# Patient Record
Sex: Male | Born: 1955
Health system: Southern US, Community
[De-identification: ages and names within clinical notes are randomized; demographics above are authoritative.]

---

## 2000-01-28 ENCOUNTER — Encounter: Payer: Self-pay | Admitting: *Deleted

## 2000-01-28 ENCOUNTER — Encounter: Admission: RE | Admit: 2000-01-28 | Discharge: 2000-01-28 | Payer: Self-pay | Admitting: *Deleted

## 2004-04-05 ENCOUNTER — Ambulatory Visit (HOSPITAL_COMMUNITY): Admission: RE | Admit: 2004-04-05 | Discharge: 2004-04-05 | Payer: Self-pay | Admitting: Gastroenterology

## 2011-03-11 ENCOUNTER — Ambulatory Visit (INDEPENDENT_AMBULATORY_CARE_PROVIDER_SITE_OTHER): Payer: Self-pay | Admitting: Family Medicine

## 2011-03-11 DIAGNOSIS — Z713 Dietary counseling and surveillance: Secondary | ICD-10-CM

## 2015-02-26 DIAGNOSIS — J209 Acute bronchitis, unspecified: Secondary | ICD-10-CM | POA: Diagnosis not present

## 2015-02-26 DIAGNOSIS — J101 Influenza due to other identified influenza virus with other respiratory manifestations: Secondary | ICD-10-CM | POA: Diagnosis not present

## 2015-02-26 MED FILL — HYDROCODONE-CHLORPHENIRAM S: 10-8 | 12 days supply | Qty: 60 | Fill #0

## 2015-02-26 MED FILL — AZITHROMYCIN 250 MG TABLET: 250 | 5 days supply | Qty: 6 | Fill #0

## 2015-02-26 MED FILL — OSELTAMIVIR PHOS 75 MG CAP: 75 | 5 days supply | Qty: 10 | Fill #0

## 2015-05-16 DIAGNOSIS — R030 Elevated blood-pressure reading, without diagnosis of hypertension: Secondary | ICD-10-CM | POA: Diagnosis not present

## 2015-05-16 DIAGNOSIS — Z13220 Encounter for screening for lipoid disorders: Secondary | ICD-10-CM | POA: Diagnosis not present

## 2015-05-16 DIAGNOSIS — Z125 Encounter for screening for malignant neoplasm of prostate: Secondary | ICD-10-CM | POA: Diagnosis not present

## 2015-05-16 DIAGNOSIS — Z0000 Encounter for general adult medical examination without abnormal findings: Secondary | ICD-10-CM | POA: Diagnosis not present

## 2015-05-18 MED FILL — AZITHROMYCIN 250 MG TABLET: 250 | 5 days supply | Qty: 6 | Fill #0

## 2015-07-31 MED FILL — LISINOPRIL 10 MG TABLET: 10 | 30 days supply | Qty: 30 | Fill #0

## 2015-08-21 DIAGNOSIS — D72829 Elevated white blood cell count, unspecified: Secondary | ICD-10-CM | POA: Diagnosis not present

## 2015-08-21 DIAGNOSIS — R635 Abnormal weight gain: Secondary | ICD-10-CM | POA: Diagnosis not present

## 2015-08-21 DIAGNOSIS — I10 Essential (primary) hypertension: Secondary | ICD-10-CM | POA: Diagnosis not present

## 2015-08-21 MED FILL — LISINOPRIL 20 MG TABLET: 20 | 90 days supply | Qty: 90 | Fill #0

## 2015-10-26 DIAGNOSIS — H5213 Myopia, bilateral: Secondary | ICD-10-CM | POA: Diagnosis not present

## 2015-11-21 DIAGNOSIS — I10 Essential (primary) hypertension: Secondary | ICD-10-CM | POA: Diagnosis not present

## 2015-11-21 MED FILL — AMLODIPINE BESYLATE 5 MG TA: 5 | 30 days supply | Qty: 30 | Fill #0

## 2015-11-21 MED FILL — LISINOPRIL 20 MG TABLET: 20 | 90 days supply | Qty: 90 | Fill #0

## 2015-12-21 MED FILL — AMLODIPINE BESYLATE 5 MG TA: 5 | 90 days supply | Qty: 90 | Fill #1

## 2016-01-02 DIAGNOSIS — K219 Gastro-esophageal reflux disease without esophagitis: Secondary | ICD-10-CM | POA: Diagnosis not present

## 2016-01-02 DIAGNOSIS — I10 Essential (primary) hypertension: Secondary | ICD-10-CM | POA: Diagnosis not present

## 2016-01-02 MED FILL — OMEPRAZOLE DR 40 MG CAPSULE: 40 | 90 days supply | Qty: 90 | Fill #0

## 2016-02-25 MED FILL — LISINOPRIL 20 MG TABLET: 20 | 90 days supply | Qty: 90 | Fill #1

## 2016-03-18 MED FILL — AMLODIPINE BESYLATE 5 MG TA: 5 | 60 days supply | Qty: 60 | Fill #2

## 2016-05-22 DIAGNOSIS — Z125 Encounter for screening for malignant neoplasm of prostate: Secondary | ICD-10-CM | POA: Diagnosis not present

## 2016-05-22 DIAGNOSIS — D72829 Elevated white blood cell count, unspecified: Secondary | ICD-10-CM | POA: Diagnosis not present

## 2016-05-22 DIAGNOSIS — Z0000 Encounter for general adult medical examination without abnormal findings: Secondary | ICD-10-CM | POA: Diagnosis not present

## 2016-05-22 DIAGNOSIS — K219 Gastro-esophageal reflux disease without esophagitis: Secondary | ICD-10-CM | POA: Diagnosis not present

## 2016-05-22 DIAGNOSIS — I10 Essential (primary) hypertension: Secondary | ICD-10-CM | POA: Diagnosis not present

## 2016-05-22 DIAGNOSIS — Z13220 Encounter for screening for lipoid disorders: Secondary | ICD-10-CM | POA: Diagnosis not present

## 2016-05-22 MED FILL — AMLODIPINE BESYLATE 5 MG TA: 5 | 90 days supply | Qty: 90 | Fill #0

## 2016-05-22 MED FILL — OMEPRAZOLE DR 40 MG CAPSULE: 40 | 90 days supply | Qty: 90 | Fill #0

## 2016-05-22 MED FILL — LISINOPRIL 20 MG TAB: 20 | 90 days supply | Qty: 90 | Fill #0

## 2016-08-22 MED FILL — AMLODIPINE BESYLATE 5 MG TA: 5 | 90 days supply | Qty: 90 | Fill #1

## 2016-08-22 MED FILL — LISINOPRIL 20 MG TAB: 20 | 90 days supply | Qty: 90 | Fill #1

## 2016-08-23 MED FILL — OMEPRAZOLE DR 40 MG CAPSULE: 40 | 90 days supply | Qty: 90 | Fill #1

## 2016-11-04 DIAGNOSIS — H5213 Myopia, bilateral: Secondary | ICD-10-CM | POA: Diagnosis not present

## 2016-11-26 MED FILL — LISINOPRIL 20 MG TAB: 20 | 90 days supply | Qty: 90 | Fill #0

## 2016-11-26 MED FILL — AMLODIPINE BESYLATE 5 MG TA: 5 | 90 days supply | Qty: 90 | Fill #0

## 2016-11-26 MED FILL — OMEPRAZOLE DR 40 MG CAPSULE: 40 | 90 days supply | Qty: 90 | Fill #1

## 2016-12-04 DIAGNOSIS — I10 Essential (primary) hypertension: Secondary | ICD-10-CM | POA: Diagnosis not present

## 2016-12-04 DIAGNOSIS — Z6834 Body mass index (BMI) 34.0-34.9, adult: Secondary | ICD-10-CM | POA: Diagnosis not present

## 2016-12-04 DIAGNOSIS — K219 Gastro-esophageal reflux disease without esophagitis: Secondary | ICD-10-CM | POA: Diagnosis not present

## 2016-12-10 ENCOUNTER — Telehealth: Payer: Self-pay | Admitting: Nurse Practitioner

## 2016-12-10 DIAGNOSIS — R059 Cough, unspecified: Secondary | ICD-10-CM

## 2016-12-10 DIAGNOSIS — R05 Cough: Secondary | ICD-10-CM

## 2016-12-10 NOTE — Progress Notes (Signed)

## 2016-12-17 DIAGNOSIS — J0100 Acute maxillary sinusitis, unspecified: Secondary | ICD-10-CM | POA: Diagnosis not present

## 2016-12-17 DIAGNOSIS — J3489 Other specified disorders of nose and nasal sinuses: Secondary | ICD-10-CM | POA: Diagnosis not present

## 2016-12-17 MED FILL — HYDROCODONE-CHLORPHENIRAM S: 10-8 | 5 days supply | Qty: 50 | Fill #0

## 2017-03-02 MED FILL — AMLODIPINE BESYLATE 5 MG TA: 5 | 90 days supply | Qty: 90 | Fill #0

## 2017-03-02 MED FILL — LISINOPRIL 20 MG TABLET: 20 | 90 days supply | Qty: 90 | Fill #0

## 2017-03-02 MED FILL — OMEPRAZOLE DR 40 MG CAPSULE: 40 | 90 days supply | Qty: 90 | Fill #0

## 2017-05-26 DIAGNOSIS — K219 Gastro-esophageal reflux disease without esophagitis: Secondary | ICD-10-CM | POA: Diagnosis not present

## 2017-05-26 DIAGNOSIS — Z6834 Body mass index (BMI) 34.0-34.9, adult: Secondary | ICD-10-CM | POA: Diagnosis not present

## 2017-05-26 DIAGNOSIS — Z125 Encounter for screening for malignant neoplasm of prostate: Secondary | ICD-10-CM | POA: Diagnosis not present

## 2017-05-26 DIAGNOSIS — I10 Essential (primary) hypertension: Secondary | ICD-10-CM | POA: Diagnosis not present

## 2017-05-26 DIAGNOSIS — Z0000 Encounter for general adult medical examination without abnormal findings: Secondary | ICD-10-CM | POA: Diagnosis not present

## 2017-05-26 DIAGNOSIS — Z13220 Encounter for screening for lipoid disorders: Secondary | ICD-10-CM | POA: Diagnosis not present

## 2017-05-29 MED FILL — AMLODIPINE BESYLATE 5 MG TA: 5 | 90 days supply | Qty: 90 | Fill #1

## 2017-05-29 MED FILL — LISINOPRIL 20 MG TABLET: 20 | 90 days supply | Qty: 90 | Fill #1

## 2017-05-29 MED FILL — OMEPRAZOLE 40 MG CPDR: 40 | 90 days supply | Qty: 90 | Fill #1

## 2017-09-01 MED FILL — OMEPRAZOLE 40 MG CPDR: 40 | 90 days supply | Qty: 90 | Fill #0

## 2017-09-01 MED FILL — LISINOPRIL 20 MG TABLET: 20 | 90 days supply | Qty: 90 | Fill #0

## 2017-09-08 MED FILL — AMLODIPINE BESYLATE 5 MG TA: 5 | 90 days supply | Qty: 90 | Fill #0

## 2017-11-10 DIAGNOSIS — H353111 Nonexudative age-related macular degeneration, right eye, early dry stage: Secondary | ICD-10-CM | POA: Diagnosis not present

## 2017-11-10 DIAGNOSIS — H5213 Myopia, bilateral: Secondary | ICD-10-CM | POA: Diagnosis not present

## 2017-11-10 DIAGNOSIS — D3132 Benign neoplasm of left choroid: Secondary | ICD-10-CM | POA: Diagnosis not present

## 2017-12-01 DIAGNOSIS — I10 Essential (primary) hypertension: Secondary | ICD-10-CM | POA: Diagnosis not present

## 2017-12-14 MED FILL — AMLODIPINE BESYLATE 5 MG TA: 5 | 90 days supply | Qty: 90 | Fill #0

## 2017-12-14 MED FILL — LISINOPRIL 20 MG TABLET: 20 | 90 days supply | Qty: 90 | Fill #0

## 2017-12-14 MED FILL — OMEPRAZOLE 40 MG CPDR: 40 | 90 days supply | Qty: 90 | Fill #0

## 2018-03-16 MED FILL — OMEPRAZOLE 40 MG CPDR: 40 | 90 days supply | Qty: 90 | Fill #1

## 2018-03-16 MED FILL — AMLODIPINE BESYLATE 5 MG TA: 5 | 90 days supply | Qty: 90 | Fill #1

## 2018-03-16 MED FILL — LISINOPRIL 20 MG TABLET: 20 | 90 days supply | Qty: 90 | Fill #1

## 2018-06-01 DIAGNOSIS — R490 Dysphonia: Secondary | ICD-10-CM | POA: Diagnosis not present

## 2018-06-01 DIAGNOSIS — Z125 Encounter for screening for malignant neoplasm of prostate: Secondary | ICD-10-CM | POA: Diagnosis not present

## 2018-06-01 DIAGNOSIS — I10 Essential (primary) hypertension: Secondary | ICD-10-CM | POA: Diagnosis not present

## 2018-06-01 DIAGNOSIS — K219 Gastro-esophageal reflux disease without esophagitis: Secondary | ICD-10-CM | POA: Diagnosis not present

## 2018-06-01 DIAGNOSIS — Z0000 Encounter for general adult medical examination without abnormal findings: Secondary | ICD-10-CM | POA: Diagnosis not present

## 2018-06-01 DIAGNOSIS — Z13220 Encounter for screening for lipoid disorders: Secondary | ICD-10-CM | POA: Diagnosis not present

## 2018-06-01 MED FILL — OMEPRAZOLE 40 MG CPDR: 40 | 90 days supply | Qty: 90 | Fill #0

## 2018-06-01 MED FILL — LISINOPRIL 20 MG TABLET: 20 | 90 days supply | Qty: 90 | Fill #0

## 2018-06-01 MED FILL — AMLODIPINE BESYLATE 5 MG TA: 5 | 90 days supply | Qty: 90 | Fill #0

## 2018-06-22 DIAGNOSIS — Z125 Encounter for screening for malignant neoplasm of prostate: Secondary | ICD-10-CM | POA: Diagnosis not present

## 2018-06-22 DIAGNOSIS — Z23 Encounter for immunization: Secondary | ICD-10-CM | POA: Diagnosis not present

## 2018-06-22 DIAGNOSIS — I10 Essential (primary) hypertension: Secondary | ICD-10-CM | POA: Diagnosis not present

## 2018-06-22 DIAGNOSIS — Z13220 Encounter for screening for lipoid disorders: Secondary | ICD-10-CM | POA: Diagnosis not present

## 2018-09-01 DIAGNOSIS — Z23 Encounter for immunization: Secondary | ICD-10-CM | POA: Diagnosis not present

## 2018-09-21 MED FILL — LISINOPRIL 20 MG TABLET: 20 | 90 days supply | Qty: 90 | Fill #0

## 2018-09-21 MED FILL — AMLODIPINE BESYLATE 5 MG TA: 5 | 90 days supply | Qty: 90 | Fill #0

## 2018-09-21 MED FILL — OMEPRAZOLE DR 40 MG CAPSULE: 40 | 90 days supply | Qty: 90 | Fill #0

## 2018-11-16 DIAGNOSIS — H5213 Myopia, bilateral: Secondary | ICD-10-CM | POA: Diagnosis not present

## 2018-11-16 DIAGNOSIS — D3132 Benign neoplasm of left choroid: Secondary | ICD-10-CM | POA: Diagnosis not present

## 2018-11-16 MED FILL — FLUAD QUADRIVALENT 0.5 ML P: 0.5 | 1 days supply | Qty: 1 | Fill #0

## 2018-11-22 DIAGNOSIS — E669 Obesity, unspecified: Secondary | ICD-10-CM | POA: Diagnosis not present

## 2018-11-22 DIAGNOSIS — I10 Essential (primary) hypertension: Secondary | ICD-10-CM | POA: Diagnosis not present

## 2018-11-22 DIAGNOSIS — K219 Gastro-esophageal reflux disease without esophagitis: Secondary | ICD-10-CM | POA: Diagnosis not present

## 2018-12-21 MED FILL — LISINOPRIL 20 MG TABLET: 20 | 90 days supply | Qty: 90 | Fill #0

## 2018-12-21 MED FILL — AMLODIPINE BESYLATE 5 MG TA: 5 | 90 days supply | Qty: 90 | Fill #0

## 2018-12-21 MED FILL — OMEPRAZOLE DR 40 MG CAPSULE: 40 | 90 days supply | Qty: 90 | Fill #0

## 2018-12-22 DIAGNOSIS — Z23 Encounter for immunization: Secondary | ICD-10-CM | POA: Diagnosis not present

## 2018-12-22 DIAGNOSIS — I10 Essential (primary) hypertension: Secondary | ICD-10-CM | POA: Diagnosis not present

## 2019-03-04 ENCOUNTER — Ambulatory Visit: Payer: 59 | Attending: Internal Medicine

## 2019-03-04 DIAGNOSIS — Z23 Encounter for immunization: Secondary | ICD-10-CM | POA: Insufficient documentation

## 2019-03-04 NOTE — Progress Notes (Signed)
   Covid-19 Vaccination Clinic  Name:  Steven Velasquez    MRN: [id number] DOB: [birth year]  03/04/2019  [name] was observed post Covid-19 immunization for 15 minutes without incidence. He was provided with Vaccine Information Sheet and instruction to access the V-Safe system.   [name] was instructed to call 911 with any severe reactions post vaccine: Marland Kitchen Difficulty breathing  . Swelling of your face and throat  . A fast heartbeat  . A bad rash all over your body  . Dizziness and weakness    Immunizations Administered    Name Date Dose VIS Date Route   Pfizer COVID-19 Vaccine 03/04/2019 12:53 PM 0.3 mL 12/31/2018 Intramuscular   Manufacturer: [place]   Lot: [id number]   [place]: [id number]

## 2019-03-28 ENCOUNTER — Ambulatory Visit: Payer: 59 | Attending: Internal Medicine

## 2019-03-28 DIAGNOSIS — Z23 Encounter for immunization: Secondary | ICD-10-CM | POA: Insufficient documentation

## 2019-03-28 MED FILL — OMEPRAZOLE 40 MG CPDR: 40 | 90 days supply | Qty: 90 | Fill #1

## 2019-03-28 MED FILL — AMLODIPINE BESYLATE 5 MG TA: 5 | 90 days supply | Qty: 90 | Fill #1

## 2019-03-28 MED FILL — LISINOPRIL 20 MG TABLET: 20 | 90 days supply | Qty: 90 | Fill #1

## 2019-03-28 NOTE — Progress Notes (Signed)
   Covid-19 Vaccination Clinic  Name:  Steven Velasquez    MRN: [id number] DOB: 03-29-1955  03/28/2019  [name] was observed post Covid-19 immunization for 15 minutes without incident. He was provided with Vaccine Information Sheet and instruction to access the V-Safe system.   [name] was instructed to call 911 with any severe reactions post vaccine: Marland Kitchen Difficulty breathing  . Swelling of face and throat  . A fast heartbeat  . A bad rash all over body  . Dizziness and weakness   Immunizations Administered    Name Date Dose VIS Date Route   Pfizer COVID-19 Vaccine 03/28/2019  8:14 AM 0.3 mL 12/31/2018 Intramuscular   Manufacturer: [place]   Lot: [id number]   [place]: [id number]

## 2019-09-28 MED FILL — AMLODIPINE BESYLATE 5 MG TA: 5 | 90 days supply | Qty: 90 | Fill #1

## 2019-09-28 MED FILL — LISINOPRIL 20 MG TABLET: 20 | 90 days supply | Qty: 90 | Fill #1

## 2019-09-28 MED FILL — OMEPRAZOLE 40 MG CPDR: 40 | 90 days supply | Qty: 90 | Fill #1

## 2019-09-30 MED FILL — FLUAD QUADRIVALENT 0.5 ML P: 0.5 | 1 days supply | Qty: 1 | Fill #0

## 2019-11-09 DIAGNOSIS — Z125 Encounter for screening for malignant neoplasm of prostate: Secondary | ICD-10-CM | POA: Diagnosis not present

## 2019-11-09 DIAGNOSIS — I10 Essential (primary) hypertension: Secondary | ICD-10-CM | POA: Diagnosis not present

## 2019-11-09 DIAGNOSIS — Z0000 Encounter for general adult medical examination without abnormal findings: Secondary | ICD-10-CM | POA: Diagnosis not present

## 2019-11-09 DIAGNOSIS — K219 Gastro-esophageal reflux disease without esophagitis: Secondary | ICD-10-CM | POA: Diagnosis not present

## 2019-11-09 DIAGNOSIS — E781 Pure hyperglyceridemia: Secondary | ICD-10-CM | POA: Diagnosis not present

## 2019-12-02 ENCOUNTER — Other Ambulatory Visit: Payer: Self-pay | Admitting: Internal Medicine

## 2019-12-02 ENCOUNTER — Ambulatory Visit: Payer: 59 | Attending: Internal Medicine

## 2019-12-02 DIAGNOSIS — Z23 Encounter for immunization: Secondary | ICD-10-CM

## 2019-12-02 NOTE — Progress Notes (Signed)
   Covid-19 Vaccination Clinic  Name:  Steven Velasquez    MRN: [id number] DOB: 07/08/1955  12/02/2019  Steven Velasquez was observed post Covid-19 immunization for 15 minutes without incident. He was provided with Vaccine Information Sheet and instruction to access the V-Safe system.   Steven Velasquez was instructed to call 911 with any severe reactions post vaccine: Marland Kitchen Difficulty breathing  . Swelling of face and throat  . A fast heartbeat  . A bad rash all over body  . Dizziness and weakness

## 2020-01-02 ENCOUNTER — Other Ambulatory Visit (HOSPITAL_COMMUNITY): Payer: Self-pay | Admitting: Family Medicine

## 2020-01-02 MED FILL — OMEPRAZOLE 40 MG CPDR: 40 | 90 days supply | Qty: 90 | Fill #0

## 2020-01-02 MED FILL — LISINOPRIL 20 MG TABLET: 20 | 90 days supply | Qty: 90 | Fill #0

## 2020-01-02 MED FILL — AMLODIPINE BESYLATE 5 MG TA: 5 | 90 days supply | Qty: 90 | Fill #0

## 2020-01-26 ENCOUNTER — Other Ambulatory Visit (HOSPITAL_COMMUNITY): Payer: Self-pay | Admitting: Allergy and Immunology

## 2020-01-26 ENCOUNTER — Other Ambulatory Visit: Payer: Self-pay

## 2020-01-26 ENCOUNTER — Other Ambulatory Visit: Payer: Self-pay | Admitting: Allergy and Immunology

## 2020-01-26 ENCOUNTER — Ambulatory Visit
Admission: RE | Admit: 2020-01-26 | Discharge: 2020-01-26 | Disposition: A | Discharge status: Home / Self Care | Payer: 59 | Source: Ambulatory Visit | Attending: Allergy and Immunology | Admitting: Allergy and Immunology

## 2020-01-26 DIAGNOSIS — H1045 Other chronic allergic conjunctivitis: Secondary | ICD-10-CM | POA: Diagnosis not present

## 2020-01-26 DIAGNOSIS — R059 Cough, unspecified: Secondary | ICD-10-CM | POA: Diagnosis not present

## 2020-01-26 DIAGNOSIS — J300 Vasomotor rhinitis: Secondary | ICD-10-CM | POA: Diagnosis not present

## 2020-01-26 DIAGNOSIS — Z91018 Allergy to other foods: Secondary | ICD-10-CM | POA: Diagnosis not present

## 2020-01-26 MED FILL — ALBUTEROL SULFATE HFA 108 (: 108 (90 BAS | 17 days supply | Qty: 18 | Fill #0

## 2020-01-26 MED FILL — AEROCHAMBER: 1 days supply | Qty: 1 | Fill #0

## 2020-01-27 DIAGNOSIS — H5213 Myopia, bilateral: Secondary | ICD-10-CM | POA: Diagnosis not present

## 2020-01-27 DIAGNOSIS — H353131 Nonexudative age-related macular degeneration, bilateral, early dry stage: Secondary | ICD-10-CM | POA: Diagnosis not present

## 2020-01-27 DIAGNOSIS — D3132 Benign neoplasm of left choroid: Secondary | ICD-10-CM | POA: Diagnosis not present

## 2020-01-31 MED FILL — MAGIC MOUTHWASH BOP FORM: 14 days supply | Qty: 280 | Fill #0

## 2020-05-09 DIAGNOSIS — I10 Essential (primary) hypertension: Secondary | ICD-10-CM | POA: Diagnosis not present

## 2020-05-09 DIAGNOSIS — K219 Gastro-esophageal reflux disease without esophagitis: Secondary | ICD-10-CM | POA: Diagnosis not present

## 2020-07-01 ENCOUNTER — Other Ambulatory Visit (HOSPITAL_COMMUNITY): Payer: Self-pay

## 2020-07-02 ENCOUNTER — Other Ambulatory Visit (HOSPITAL_COMMUNITY): Payer: Self-pay

## 2020-07-02 MED ORDER — AMLODIPINE BESYLATE 5 MG PO TABS
5.0000 mg | ORAL_TABLET | Freq: Every day | ORAL | 1 refills | Status: DC
  Filled 2020-07-02: qty 90, 90d supply, fill #0
  Filled 2020-10-15: qty 90, 90d supply, fill #1

## 2020-07-02 MED ORDER — OMEPRAZOLE 40 MG PO CPDR
40.0000 mg | DELAYED_RELEASE_CAPSULE | Freq: Every day | ORAL | 1 refills | Status: DC
  Filled 2020-07-02: qty 90, 90d supply, fill #0
  Filled 2020-10-15: qty 90, 90d supply, fill #1

## 2020-07-03 ENCOUNTER — Other Ambulatory Visit (HOSPITAL_COMMUNITY): Payer: Self-pay

## 2020-07-05 ENCOUNTER — Other Ambulatory Visit (HOSPITAL_COMMUNITY): Payer: Self-pay

## 2020-07-12 ENCOUNTER — Other Ambulatory Visit (HOSPITAL_COMMUNITY): Payer: Self-pay

## 2020-07-12 MED ORDER — CLINDAMYCIN HCL 150 MG PO CAPS
600.0000 mg | ORAL_CAPSULE | ORAL | 0 refills | Status: AC
  Filled 2020-07-12: qty 4, 1d supply, fill #0

## 2020-07-13 ENCOUNTER — Other Ambulatory Visit (HOSPITAL_COMMUNITY): Payer: Self-pay

## 2020-07-13 MED ORDER — AMOXICILLIN 500 MG PO CAPS
500.0000 mg | ORAL_CAPSULE | Freq: Three times a day (TID) | ORAL | 0 refills | Status: AC
  Filled 2020-07-13: qty 15, 5d supply, fill #0

## 2020-07-13 MED ORDER — HYDROCODONE-ACETAMINOPHEN 10-325 MG PO TABS
1.0000 | ORAL_TABLET | Freq: Four times a day (QID) | ORAL | 0 refills | Status: AC | PRN
  Filled 2020-07-13: qty 10, 3d supply, fill #0

## 2020-08-07 ENCOUNTER — Other Ambulatory Visit (HOSPITAL_COMMUNITY): Payer: Self-pay

## 2020-08-07 MED ORDER — CARESTART COVID-19 HOME TEST VI KIT
PACK | 0 refills | Status: AC
  Filled 2020-08-07: qty 4, 4d supply, fill #0

## 2020-09-11 DIAGNOSIS — R059 Cough, unspecified: Secondary | ICD-10-CM | POA: Diagnosis not present

## 2020-09-11 DIAGNOSIS — H1045 Other chronic allergic conjunctivitis: Secondary | ICD-10-CM | POA: Diagnosis not present

## 2020-09-11 DIAGNOSIS — Z91018 Allergy to other foods: Secondary | ICD-10-CM | POA: Diagnosis not present

## 2020-09-11 DIAGNOSIS — J300 Vasomotor rhinitis: Secondary | ICD-10-CM | POA: Diagnosis not present

## 2020-10-15 ENCOUNTER — Other Ambulatory Visit (HOSPITAL_COMMUNITY): Payer: Self-pay

## 2020-10-16 ENCOUNTER — Other Ambulatory Visit (HOSPITAL_COMMUNITY): Payer: Self-pay

## 2020-10-16 MED ORDER — INFLUENZA VAC A&B SA ADJ QUAD 0.5 ML IM PRSY
0.5000 mL | PREFILLED_SYRINGE | INTRAMUSCULAR | 0 refills | Status: AC
  Filled 2020-10-16: qty 0.5, 1d supply, fill #0

## 2021-01-07 DIAGNOSIS — Z0000 Encounter for general adult medical examination without abnormal findings: Secondary | ICD-10-CM | POA: Diagnosis not present

## 2021-01-07 DIAGNOSIS — E669 Obesity, unspecified: Secondary | ICD-10-CM | POA: Diagnosis not present

## 2021-01-07 DIAGNOSIS — I10 Essential (primary) hypertension: Secondary | ICD-10-CM | POA: Diagnosis not present

## 2021-01-07 DIAGNOSIS — K219 Gastro-esophageal reflux disease without esophagitis: Secondary | ICD-10-CM | POA: Diagnosis not present

## 2021-01-07 DIAGNOSIS — E781 Pure hyperglyceridemia: Secondary | ICD-10-CM | POA: Diagnosis not present

## 2021-01-22 ENCOUNTER — Other Ambulatory Visit (HOSPITAL_COMMUNITY): Payer: Self-pay

## 2021-01-22 MED ORDER — CARESTART COVID-19 HOME TEST VI KIT
PACK | 0 refills | Status: AC
  Filled 2021-01-22: qty 4, 4d supply, fill #0

## 2021-01-23 ENCOUNTER — Other Ambulatory Visit (HOSPITAL_COMMUNITY): Payer: Self-pay

## 2021-01-23 MED ORDER — AMLODIPINE BESYLATE 5 MG PO TABS
5.0000 mg | ORAL_TABLET | Freq: Every day | ORAL | 1 refills | Status: DC
  Filled 2021-01-23: qty 90, 90d supply, fill #0
  Filled 2021-04-23: qty 90, 90d supply, fill #1

## 2021-01-23 MED ORDER — OMEPRAZOLE 40 MG PO CPDR
40.0000 mg | DELAYED_RELEASE_CAPSULE | Freq: Every day | ORAL | 0 refills | Status: DC
  Filled 2021-01-23: qty 90, 90d supply, fill #0

## 2021-01-25 ENCOUNTER — Other Ambulatory Visit (HOSPITAL_COMMUNITY): Payer: Self-pay

## 2021-02-05 DIAGNOSIS — D3132 Benign neoplasm of left choroid: Secondary | ICD-10-CM | POA: Diagnosis not present

## 2021-02-05 DIAGNOSIS — H5213 Myopia, bilateral: Secondary | ICD-10-CM | POA: Diagnosis not present

## 2021-02-05 DIAGNOSIS — H353111 Nonexudative age-related macular degeneration, right eye, early dry stage: Secondary | ICD-10-CM | POA: Diagnosis not present

## 2021-02-19 ENCOUNTER — Other Ambulatory Visit (HOSPITAL_COMMUNITY): Payer: Self-pay

## 2021-02-19 DIAGNOSIS — Z23 Encounter for immunization: Secondary | ICD-10-CM | POA: Diagnosis not present

## 2021-02-19 MED ORDER — IPRATROPIUM BROMIDE 0.03 % NA SOLN
2.0000 | Freq: Two times a day (BID) | NASAL | 5 refills | Status: AC | PRN
  Filled 2021-02-19: qty 30, 43d supply, fill #0

## 2021-02-27 ENCOUNTER — Other Ambulatory Visit (HOSPITAL_COMMUNITY): Payer: Self-pay

## 2021-02-27 MED ORDER — NYSTATIN 100000 UNIT/ML MT SUSP
30.0000 mL | Freq: Three times a day (TID) | OROMUCOSAL | 0 refills | Status: AC
  Filled 2021-02-27: qty 480, 6d supply, fill #0

## 2021-04-23 ENCOUNTER — Other Ambulatory Visit (HOSPITAL_COMMUNITY): Payer: Self-pay

## 2021-04-23 MED ORDER — OMEPRAZOLE 40 MG PO CPDR
40.0000 mg | DELAYED_RELEASE_CAPSULE | Freq: Every day | ORAL | 0 refills | Status: DC
  Filled 2021-04-23: qty 90, 90d supply, fill #0

## 2021-06-25 DIAGNOSIS — I10 Essential (primary) hypertension: Secondary | ICD-10-CM | POA: Diagnosis not present

## 2021-06-25 DIAGNOSIS — E669 Obesity, unspecified: Secondary | ICD-10-CM | POA: Diagnosis not present

## 2021-06-25 DIAGNOSIS — K219 Gastro-esophageal reflux disease without esophagitis: Secondary | ICD-10-CM | POA: Diagnosis not present

## 2021-07-24 ENCOUNTER — Other Ambulatory Visit (HOSPITAL_COMMUNITY): Payer: Self-pay

## 2021-07-24 MED ORDER — AMLODIPINE BESYLATE 5 MG PO TABS
5.0000 mg | ORAL_TABLET | Freq: Every day | ORAL | 1 refills | Status: DC
  Filled 2021-07-24: qty 90, 90d supply, fill #0
  Filled 2021-10-23: qty 90, 90d supply, fill #1

## 2021-07-24 MED ORDER — OMEPRAZOLE 40 MG PO CPDR
40.0000 mg | DELAYED_RELEASE_CAPSULE | Freq: Every day | ORAL | 0 refills | Status: DC
  Filled 2021-07-24: qty 90, 90d supply, fill #0

## 2021-09-11 ENCOUNTER — Other Ambulatory Visit (HOSPITAL_COMMUNITY): Payer: Self-pay

## 2021-09-11 DIAGNOSIS — Z91018 Allergy to other foods: Secondary | ICD-10-CM | POA: Diagnosis not present

## 2021-09-11 DIAGNOSIS — R052 Subacute cough: Secondary | ICD-10-CM | POA: Diagnosis not present

## 2021-09-11 DIAGNOSIS — H1045 Other chronic allergic conjunctivitis: Secondary | ICD-10-CM | POA: Diagnosis not present

## 2021-09-11 DIAGNOSIS — J300 Vasomotor rhinitis: Secondary | ICD-10-CM | POA: Diagnosis not present

## 2021-09-11 MED ORDER — IPRATROPIUM BROMIDE 0.03 % NA SOLN
1.0000 | Freq: Three times a day (TID) | NASAL | 5 refills | Status: AC
  Filled 2021-09-11: qty 30, 29d supply, fill #0

## 2021-10-23 ENCOUNTER — Other Ambulatory Visit (HOSPITAL_COMMUNITY): Payer: Self-pay

## 2021-10-24 IMAGING — CR DG CHEST 2V
2 series · 2 of 2 positions shown · non-contrast
Comparison: None.

CLINICAL DATA: Cough

EXAM:
CHEST - 2 VIEW

[w chest pa]
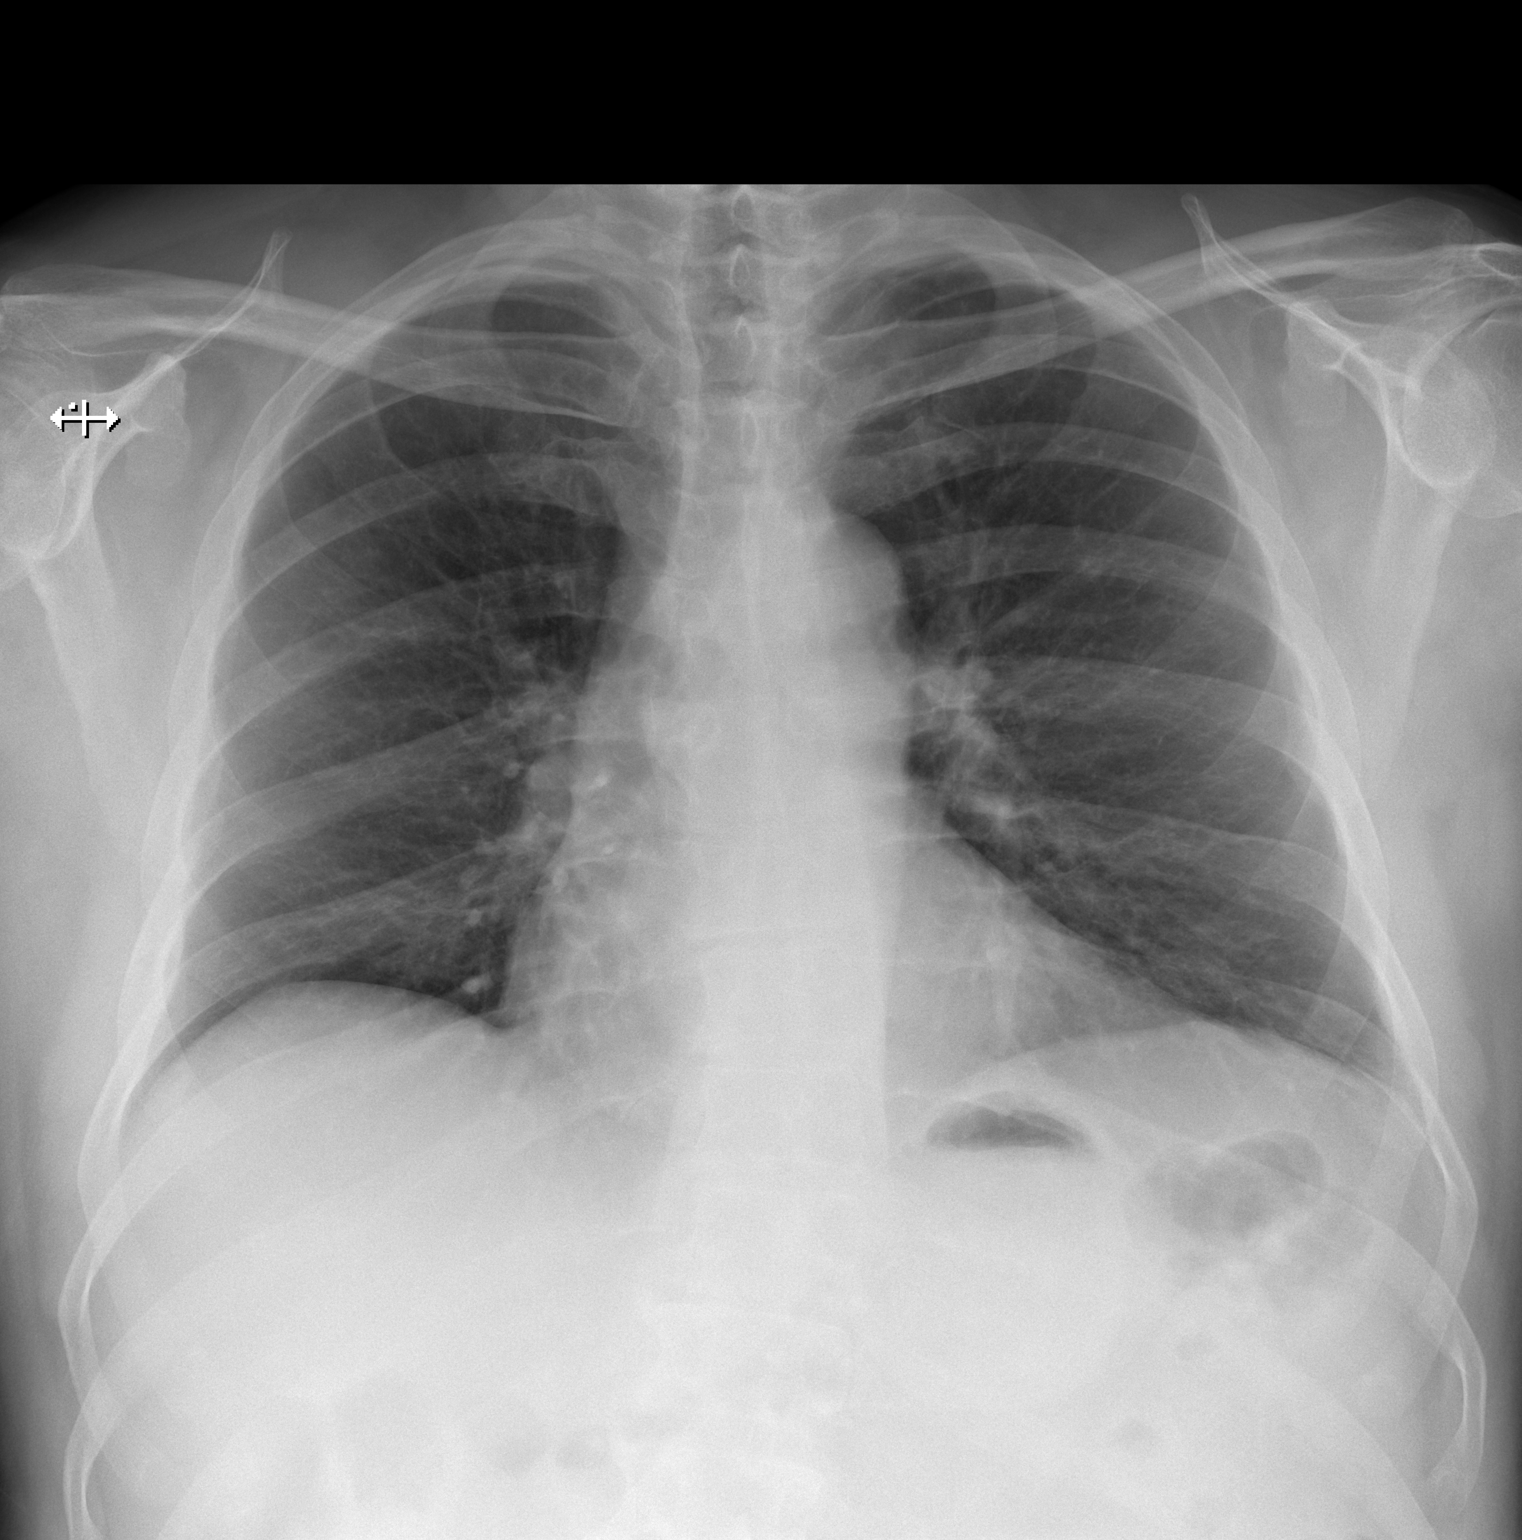

[w chest lat]
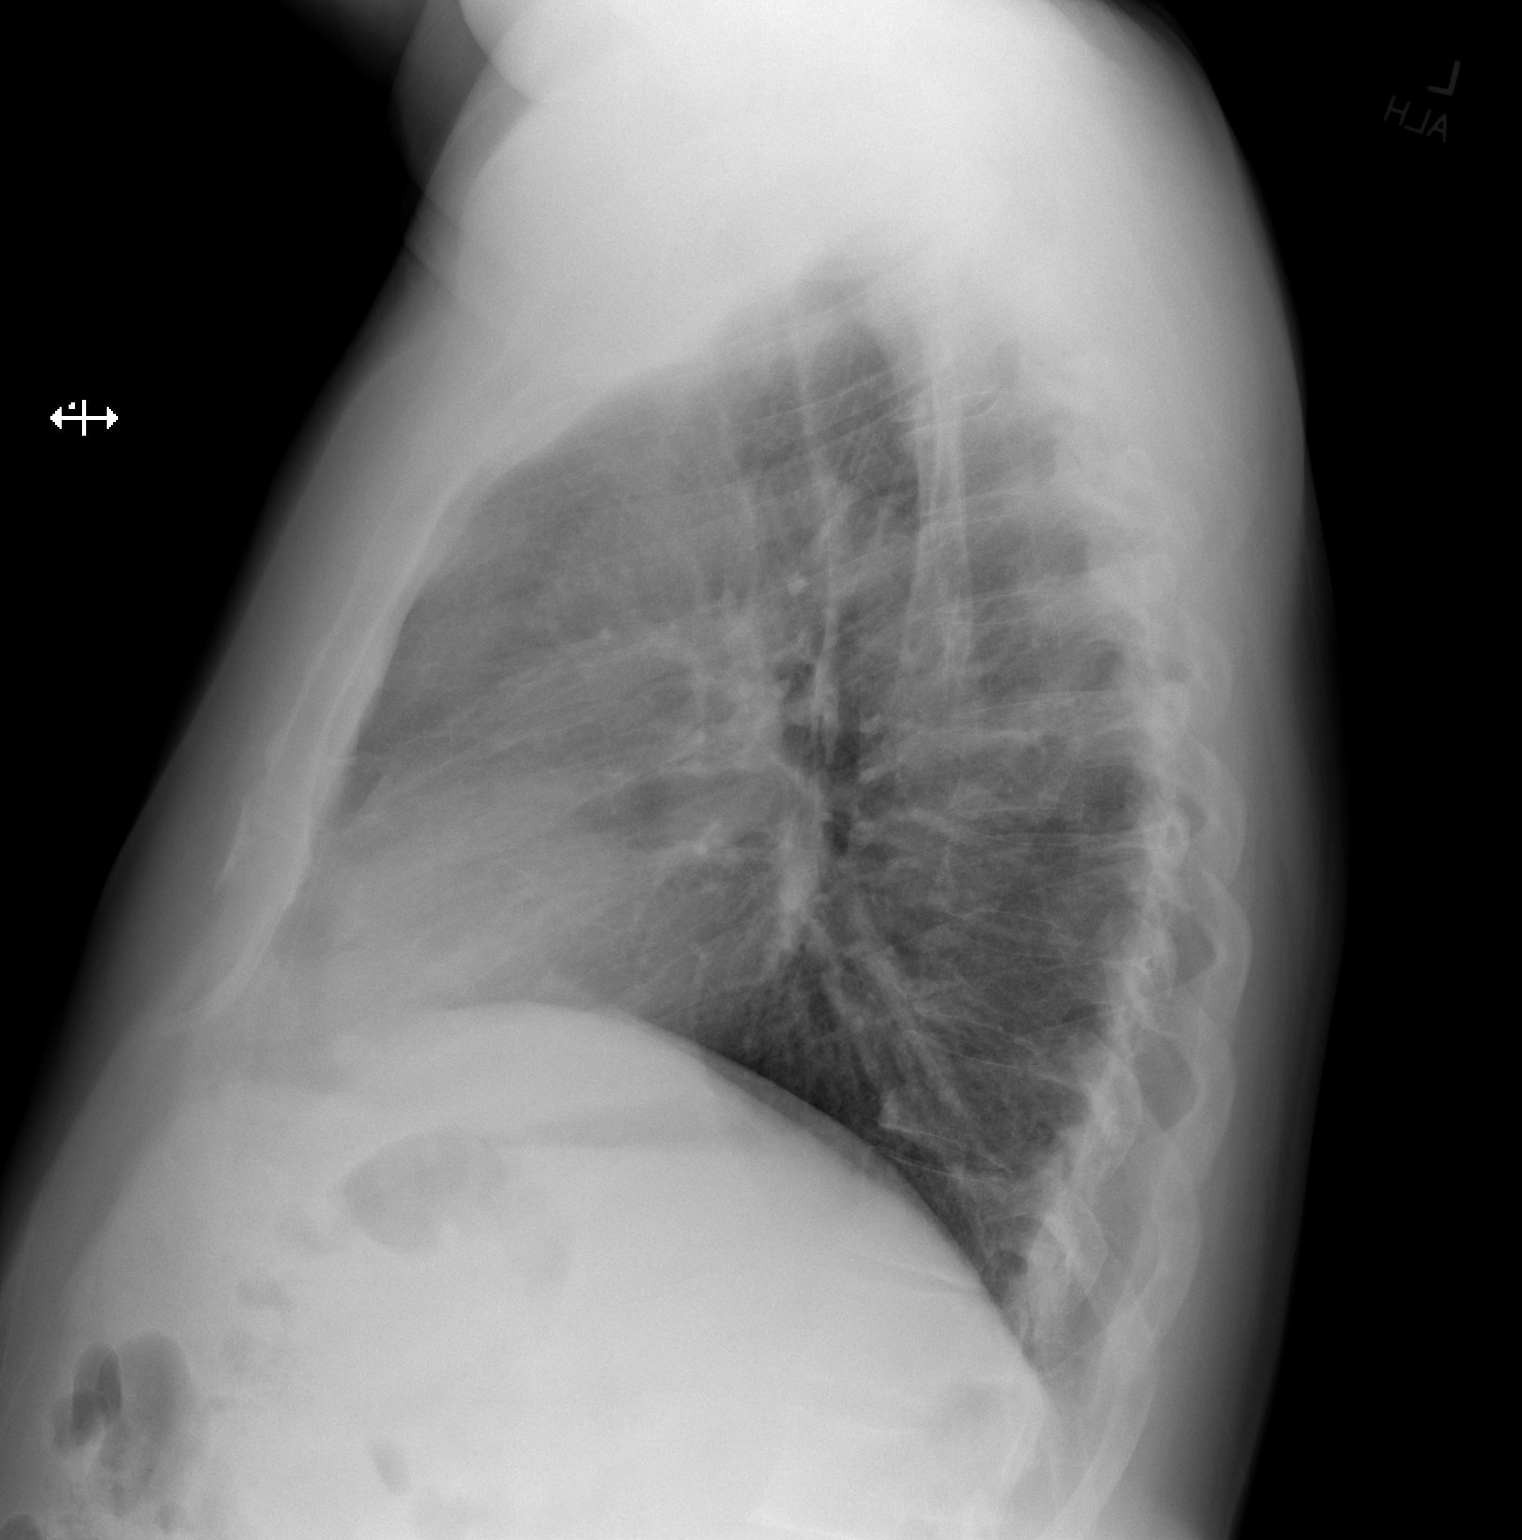

[2 of 2 positions shown; findings below may reference images not displayed]

FINDINGS: The heart size and mediastinal contours are within normal limits.
Both lungs are clear. The visualized skeletal structures are
unremarkable.
IMPRESSION: No active cardiopulmonary disease.

## 2021-10-26 ENCOUNTER — Other Ambulatory Visit (HOSPITAL_COMMUNITY): Payer: Self-pay

## 2021-10-26 MED ORDER — OMEPRAZOLE 40 MG PO CPDR
40.0000 mg | DELAYED_RELEASE_CAPSULE | Freq: Every day | ORAL | 1 refills | Status: DC
  Filled 2021-10-26: qty 90, 90d supply, fill #0
  Filled 2022-01-21 – 2022-01-29 (×2): qty 90, 90d supply, fill #1

## 2021-10-28 ENCOUNTER — Other Ambulatory Visit (HOSPITAL_COMMUNITY): Payer: Self-pay

## 2021-10-29 ENCOUNTER — Other Ambulatory Visit (HOSPITAL_COMMUNITY): Payer: Self-pay

## 2021-10-31 ENCOUNTER — Other Ambulatory Visit (HOSPITAL_COMMUNITY): Payer: Self-pay

## 2021-10-31 MED ORDER — INFLUENZA VAC A&B SA ADJ QUAD 0.5 ML IM PRSY
0.5000 mL | PREFILLED_SYRINGE | INTRAMUSCULAR | 0 refills | Status: AC
Start: 2021-10-31 — End: ?
  Filled 2021-10-31: qty 0.5, 1d supply, fill #0

## 2022-01-07 ENCOUNTER — Other Ambulatory Visit (HOSPITAL_COMMUNITY): Payer: Self-pay

## 2022-01-21 ENCOUNTER — Other Ambulatory Visit (HOSPITAL_COMMUNITY): Payer: Self-pay

## 2022-01-23 ENCOUNTER — Other Ambulatory Visit (HOSPITAL_COMMUNITY): Payer: Self-pay

## 2022-01-23 MED ORDER — AMLODIPINE BESYLATE 5 MG PO TABS
5.0000 mg | ORAL_TABLET | Freq: Every day | ORAL | 0 refills | Status: DC
  Filled 2022-01-23: qty 90, 90d supply, fill #0

## 2022-01-29 ENCOUNTER — Other Ambulatory Visit (HOSPITAL_COMMUNITY): Payer: Self-pay

## 2022-02-11 DIAGNOSIS — D3132 Benign neoplasm of left choroid: Secondary | ICD-10-CM | POA: Diagnosis not present

## 2022-02-11 DIAGNOSIS — H5213 Myopia, bilateral: Secondary | ICD-10-CM | POA: Diagnosis not present

## 2022-02-13 ENCOUNTER — Other Ambulatory Visit (HOSPITAL_COMMUNITY): Payer: Self-pay

## 2022-02-13 DIAGNOSIS — I10 Essential (primary) hypertension: Secondary | ICD-10-CM | POA: Diagnosis not present

## 2022-02-13 DIAGNOSIS — E781 Pure hyperglyceridemia: Secondary | ICD-10-CM | POA: Diagnosis not present

## 2022-02-13 DIAGNOSIS — Z23 Encounter for immunization: Secondary | ICD-10-CM | POA: Diagnosis not present

## 2022-02-13 DIAGNOSIS — Z0000 Encounter for general adult medical examination without abnormal findings: Secondary | ICD-10-CM | POA: Diagnosis not present

## 2022-02-13 DIAGNOSIS — Z125 Encounter for screening for malignant neoplasm of prostate: Secondary | ICD-10-CM | POA: Diagnosis not present

## 2022-02-13 DIAGNOSIS — J329 Chronic sinusitis, unspecified: Secondary | ICD-10-CM | POA: Diagnosis not present

## 2022-02-13 DIAGNOSIS — K219 Gastro-esophageal reflux disease without esophagitis: Secondary | ICD-10-CM | POA: Diagnosis not present

## 2022-02-13 MED ORDER — OMEPRAZOLE 40 MG PO CPDR
40.0000 mg | DELAYED_RELEASE_CAPSULE | Freq: Every day | ORAL | 1 refills | Status: DC
  Filled 2022-02-13 – 2022-05-09 (×2): qty 90, 90d supply, fill #0
  Filled 2022-08-11: qty 90, 90d supply, fill #1

## 2022-02-13 MED ORDER — AZITHROMYCIN 250 MG PO TABS
ORAL_TABLET | ORAL | 0 refills | Status: AC
  Filled 2022-02-13: qty 6, 5d supply, fill #0

## 2022-02-13 MED ORDER — AMLODIPINE BESYLATE 5 MG PO TABS
5.0000 mg | ORAL_TABLET | Freq: Every day | ORAL | 1 refills | Status: DC
  Filled 2022-02-13 – 2022-05-09 (×2): qty 90, 90d supply, fill #0
  Filled 2022-08-11: qty 90, 90d supply, fill #1

## 2022-02-24 ENCOUNTER — Other Ambulatory Visit (HOSPITAL_COMMUNITY): Payer: Self-pay

## 2022-02-24 MED ORDER — AREXVY 120 MCG/0.5ML IM SUSR
0.5000 mL | INTRAMUSCULAR | 0 refills | Status: DC
  Filled 2022-02-24: qty 0.5, 1d supply, fill #0
  Filled 2022-03-06: qty 0.5, fill #0

## 2022-03-06 ENCOUNTER — Other Ambulatory Visit (HOSPITAL_COMMUNITY): Payer: Self-pay

## 2022-04-04 ENCOUNTER — Other Ambulatory Visit (HOSPITAL_COMMUNITY): Payer: Self-pay

## 2022-04-04 MED ORDER — AREXVY 120 MCG/0.5ML IM SUSR
0.5000 mL | Freq: Once | INTRAMUSCULAR | 0 refills | Status: AC
  Filled 2022-04-04: qty 0.5, 1d supply, fill #0

## 2022-04-07 ENCOUNTER — Other Ambulatory Visit (HOSPITAL_COMMUNITY): Payer: Self-pay

## 2022-05-09 ENCOUNTER — Other Ambulatory Visit (HOSPITAL_COMMUNITY): Payer: Self-pay

## 2022-05-16 ENCOUNTER — Other Ambulatory Visit: Payer: Self-pay

## 2022-08-11 ENCOUNTER — Other Ambulatory Visit (HOSPITAL_COMMUNITY): Payer: Self-pay

## 2022-08-26 ENCOUNTER — Other Ambulatory Visit (HOSPITAL_COMMUNITY): Payer: Self-pay

## 2022-08-26 DIAGNOSIS — J383 Other diseases of vocal cords: Secondary | ICD-10-CM | POA: Diagnosis not present

## 2022-08-26 DIAGNOSIS — I10 Essential (primary) hypertension: Secondary | ICD-10-CM | POA: Diagnosis not present

## 2022-08-26 DIAGNOSIS — K219 Gastro-esophageal reflux disease without esophagitis: Secondary | ICD-10-CM | POA: Diagnosis not present

## 2022-08-26 MED ORDER — AMLODIPINE BESYLATE 5 MG PO TABS
5.0000 mg | ORAL_TABLET | Freq: Every day | ORAL | 1 refills | Status: DC
  Filled 2022-08-26 – 2022-11-17 (×3): qty 90, 90d supply, fill #0
  Filled 2023-02-27: qty 90, 90d supply, fill #1

## 2022-08-26 MED ORDER — OMEPRAZOLE 40 MG PO CPDR
40.0000 mg | DELAYED_RELEASE_CAPSULE | Freq: Every day | ORAL | 1 refills | Status: DC
  Filled 2022-08-26 – 2022-11-17 (×3): qty 90, 90d supply, fill #0
  Filled 2023-02-27: qty 90, 90d supply, fill #1

## 2022-09-10 ENCOUNTER — Ambulatory Visit: Payer: Medicare Other | Admitting: Speech Pathology

## 2022-09-15 NOTE — Therapy (Unsigned)
OUTPATIENT SPEECH LANGUAGE PATHOLOGY VOICE EVALUATION   Patient Name: Steven Velasquez MRN: [id number] DOB:08-10-1955, 67 y.o., male Today's Date: 09/16/2022  PCP: None provided REFERRING PROVIDER: Merri Brunette MD  END OF SESSION:  End of Session - 09/16/22 0859     Visit Number 1    Number of Visits 17    Date for SLP Re-Evaluation 11/11/22    Authorization Type [employer]na/Medicare    SLP Start Time 0800    SLP Stop Time  0845    SLP Time Calculation (min) 45 min    Activity Tolerance Patient tolerated treatment well             History reviewed. No pertinent past medical history. History reviewed. No pertinent surgical history. There are no problems to display for this patient.   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  this referral will be set up"   No ENT documentation found in our system but pt provided info via text from son-in-law  PAIN: Are you having pain? No  FALLS: Has patient fallen in last 6 months? No  LIVING ENVIRONMENT: Lives with: lives with their spouse Lives in: House/apartment  PLOF:Level of assistance: Independent with ADLs, Independent with IADLs Employment: Full-time employment  PATIENT GOALS: "want to get my voice back"  OBJECTIVE:   DIAGNOSTIC FINDINGS: Laryngoscopy with dx of muscle tension dysphonia  COGNITION: Overall cognitive status: Within functional limits for tasks  assessed  SOCIAL HISTORY: Occupation: Full time Merchandiser, retail intake: optimal Caffeine/alcohol intake: minimal Daily voice use: moderate and excessive  PERCEPTUAL VOICE ASSESSMENT: Voice quality: hoarse, rough, strained, and vocal fatigue Vocal abuse: habitual loudness and excessive voice use (occupation related) Resonance: normal Respiratory function: thoracic breathing, clavicular breathing, and speaking on residual capacity  OBJECTIVE VOICE ASSESSMENT: Maximum phonation time for sustained "ah": 10 seconds, 15 seconds with cues Conversational loudness average: upper 60s dB in quiet environment (suspect WNL outside therapy room based on pt report/occupation) S/z ratio: 8/10 seconds (Suggestive of dysfunction >1.0)  PATIENT REPORTED OUTCOME MEASURES (PROM): V-RQOL: 13  TODAY'S TREATMENT:                                                                                                                                         09/16/22: Educated patient on 3 sub-systems of voice. Assessment revealed reduced breath support and more back focused  phonation resulting in increasingly hoarse, rough vocal quality. Initiated instruction of abdominal breathing with demo and handout provided. Provided recommendations for throat clear alternatives given intermittent throat clearing exhibited today.    PATIENT EDUCATION: Education details: see above Person educated: Patient Education method: Explanation, Demonstration, and Handouts Education comprehension: verbalized understanding, returned demonstration, and needs further education  GOALS: Goals reviewed with patient? Yes  SHORT TERM GOALS: Target date: 10/14/2022  Pt will complete recommended HEP 2/2 sessions Baseline: Goal status: INITIAL  2.  Pt will achieve clear vocal quality for 80% of trials during structured task x2 given rare min A  Baseline:  Goal status: INITIAL  3.  Pt will ID and attempt to correct hoarse voice for 80% of  opportunities during structured conversation x2 given rare min A  Baseline:  Goal status: INITIAL  4.  Pt will implement throat clear alternatives to optimize vocal hygiene given rare min A by STG date Baseline:  Goal status: INITIAL  5.  Pt will maintain clear vocal quality for 10-15 minute unstructured conversation x2 given rare min A  Baseline:  Goal status: INITIAL  LONG TERM GOALS: Target date: 11/11/2022  Pt will maintain clear vocal quality during 30+ minute unstructured conversations x2 given rare min A  Baseline:  Goal status: INITIAL  2.  Pt will report successful carryover of learned techniques with clear vocal quality achieved/maintained for sermons x2  Baseline:  Goal status: INITIAL  3.  Pt will report improved voice quality via PROM by 2 points by LTG date Baseline: 13 Goal status: INITIAL   ASSESSMENT:  CLINICAL IMPRESSION: Patient is a 67 y.o. M who was seen today for dysphonia. Pt's son-in-law is an ENT in [place], who performed larygnoscopy with dx of muscle tension dysphonia. 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 More recently began using microphone to aid projection in church. Also reported inability to sing without pitch breaks. Endorsed hx of chronic cough, which resolved after terminating lisonpril. C/o frequent post nasal drip and intermittent sensation of mucous in larynx. Occasional throat clearing and mild coughing noted during evaluation. Today, voice c/b intermittent increasing to usual hoarseness as result of reduced breath support, back focused phonation, and muscle tension in larynx. Demonstrated increased vocal clarity with cues for breath support and projection. Given impact on personal and professional voice use, pt would benefit from skilled ST intervention to optimize vocal efficiency for improved vocal quality.   OBJECTIVE IMPAIRMENTS:  include voice disorder. These impairments are limiting patient from effectively communicating at home and in community. Factors affecting potential to achieve goals and functional outcome are  None . Patient will benefit from skilled SLP services to address above impairments and improve overall function.  REHAB POTENTIAL: Good  PLAN:  SLP FREQUENCY: 2x/week  SLP DURATION: 8 weeks  PLANNED INTERVENTIONS: Cueing hierachy, Internal/external aids, Functional tasks, SLP instruction and feedback, Compensatory strategies, and Patient/family education    Gracy Racer, CCC-SLP 09/16/2022, 12:05 PM

## 2022-09-16 ENCOUNTER — Ambulatory Visit: Payer: Commercial Managed Care - PPO | Attending: Family Medicine

## 2022-09-16 DIAGNOSIS — R498 Other voice and resonance disorders: Secondary | ICD-10-CM | POA: Diagnosis not present

## 2022-09-16 NOTE — Patient Instructions (Addendum)
Abdominal Breathing : 15 minutes, twice a day  Shoulders down - this is a cue to relax Place your hand on your abdomen - this helps you focus on easy abdominal breath support - the best and most relaxed way to breathe Breathe in through your nose and fill your belly with air, watching your hand move outward Breathe out through your mouth and watch your belly move in. An audible "sh"  may help  Think of your belly as a balloon, when you fill with air (inhale), the balloon gets bigger. As the air goes out (exhale), the balloon deflates.  If you are having difficulty coordinating this, lay on your back with a plastic cup on your belly and repeat the above steps, watching your belly move up with inhalation and down with exhalations  Practice breathing in and out in front of a mirror, watching your belly Breathe in for a count of 5 and breathe out for a count of 5   Be mindful of throat clearing. Take note of how often this is occurring. Throat clearing can be irritating to your voice box. So instead, take a sip of water and hard swallow.

## 2022-09-23 ENCOUNTER — Ambulatory Visit: Payer: Commercial Managed Care - PPO | Attending: Family Medicine | Admitting: Speech Pathology

## 2022-09-23 DIAGNOSIS — R498 Other voice and resonance disorders: Secondary | ICD-10-CM | POA: Diagnosis present

## 2022-09-23 DIAGNOSIS — J383 Other diseases of vocal cords: Secondary | ICD-10-CM | POA: Insufficient documentation

## 2022-09-23 NOTE — Patient Instructions (Signed)
  Semi-occluded vocal tract exercises (SOVTE)  These allow your vocal folds to vibrate without excess tension and promotes high placement of the voice  Use SOVTE as a warm up before prolonged speaking and vocal exercises  Medium resistance: voicing through a drinking straw  Watch Vocal Straw Exercises with Lenox Ahr on YouTube:  [email or web address]  Sustained "ooooh"   Pitch Glides (up & down)   Accents (siren)  Hum the [employer]  A goal would be 2-3 minutes several times a day and prior to vocal exercises  As always, use good belly breathing while completing SOVTE    Vocal Function Exercises  WARM UP Hold out ooll as long and you can with good quality Remember: LOUD and STRONG Feel the sound in your face, not your throat If you have excess tension in your throat, try pushing hands together at your chest  STRETCH Glide from a low note to a high note Use ooll Maintain your good quality sound that we practiced in therapy CONTRACT  Glide from a high note to a low note Use ooll Maintain your good quality sound that we practiced in therapy SUSTAINED PHONATION Use ooll Hold out as long as possible at low note Hold out as long as possible at mid note Hold out as long as possible at high note  Complete each part 2x, 3x a day   Remember these are exercises! They are supposed to be challenging!!

## 2022-09-23 NOTE — Therapy (Signed)
OUTPATIENT SPEECH LANGUAGE PATHOLOGY VOICE TREATMENT   Patient Name: Steven Velasquez MRN: [id number] DOB:Oct 08, 1955, 67 y.o., male Today's Date: 09/23/2022  PCP: None provided REFERRING PROVIDER: Merri Brunette MD  END OF SESSION:  End of Session - 09/23/22 0815     Visit Number 2    Number of Visits 17    Date for SLP Re-Evaluation 11/11/22    Authorization Type [employer]na/Medicare    SLP Start Time [phone]   pt arrived late   SLP Stop Time  0845    SLP Time Calculation (min) 29 min    Activity Tolerance Patient tolerated treatment well             No past medical history on file. No past surgical history on file. There are no problems to display for this patient.   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  this referral will be set up"   No ENT documentation found in our system but pt provided info via text from son-in-law  PAIN: Are you having pain? No  FALLS: Has patient fallen in last 6 months? No  PATIENT GOALS: "want to get my voice back"  OBJECTIVE:   TODAY'S TREATMENT:                                                                                                                                         09/23/22: SLP educated patient re: semi-occluded vocal tract exercises purpose and execution to reduce laryngeal tension and promote balanced and coordinated use of respiratory and phonatory sub-systems. SLP provided  modeling and feedback throughout exercises, with pt able to achieve success at the song level. Straw in water was used to leverage biofeedback to optimize pt's understanding of performance. Updated HEP to include SOVTE with handout provided. Introduced Company secretary, with pt able to demonstrate clear voicing during sustained phonation and pitch glide tasks. Verbal cues required to aid in breathing to avoid pressed voice at end of task. SLP required to bring awareness to habitual throat clears prior to voicing tasks, with pt endorsing belief it is d/t need to clear mucus. Encouraged pt to reach out to son in law to ask if mucus was present in throat during scoping.   09/16/22: Educated patient on 3 sub-systems of voice. Assessment revealed reduced breath support and more back focused phonation resulting in  increasingly hoarse, rough vocal quality. Initiated instruction of abdominal breathing with demo and handout provided. Provided recommendations for throat clear alternatives given intermittent throat clearing exhibited today.   PATIENT EDUCATION: Education details: see above Person educated: Patient Education method: Explanation, Demonstration, and Handouts Education comprehension: verbalized understanding, returned demonstration, and needs further education  GOALS: Goals reviewed with patient? Yes  SHORT TERM GOALS: Target date: 10/14/2022  Pt will complete recommended HEP 2/2 sessions Baseline: Goal status: in progress  2.  Pt will achieve clear vocal quality for 80% of trials during structured task x2 given rare min A  Baseline:  Goal status: in progress  3.  Pt will ID and attempt to correct hoarse voice for 80% of opportunities during structured conversation x2 given rare min A  Baseline:  Goal status: in progress  4.  Pt will implement throat clear alternatives to optimize vocal hygiene given rare min A by STG date Baseline:  Goal status: in progress  5.  Pt will  maintain clear vocal quality for 10-15 minute unstructured conversation x2 given rare min A  Baseline:  Goal status: in progress  LONG TERM GOALS: Target date: 11/11/2022  Pt will maintain clear vocal quality during 30+ minute unstructured conversations x2 given rare min A  Baseline:  Goal status: in progress  2.  Pt will report successful carryover of learned techniques with clear vocal quality achieved/maintained for sermons x2  Baseline:  Goal status: in progress  3.  Pt will report improved voice quality via PROM by 2 points by LTG date Baseline: 13 Goal status: in progress   ASSESSMENT:  CLINICAL IMPRESSION: Patient is a 67 y.o. M who was seen today for dysphonia. Pt's son-in-law is an ENT in [place], who performed larygnoscopy with dx of muscle tension dysphonia. 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 More recently began using microphone to aid projection in church. Also reported inability to sing without pitch breaks. Endorsed hx of chronic cough, which resolved after terminating lisonpril. C/o frequent post nasal drip and intermittent sensation of mucous in larynx. Occasional throat clearing and mild coughing noted during evaluation. Today, voice c/b intermittent increasing to usual hoarseness as result of reduced breath support, back focused phonation, and muscle tension in larynx. Demonstrated increased vocal clarity with cues for breath support and projection. Given impact on personal and professional voice use, pt would benefit from skilled ST intervention to optimize vocal efficiency for improved vocal quality.   OBJECTIVE IMPAIRMENTS: include voice disorder. These impairments are limiting patient from effectively communicating at home and in community. Factors affecting potential to achieve goals and functional outcome are  None . Patient will benefit from skilled SLP  services to address above impairments and improve overall function.  REHAB POTENTIAL: Good  PLAN:  SLP FREQUENCY: 2x/week  SLP DURATION: 8 weeks  PLANNED INTERVENTIONS: Cueing hierachy, Internal/external aids, Functional tasks, SLP instruction and feedback, Compensatory strategies, and Patient/family education    Maia Breslow, CCC-SLP 09/23/2022, 8:17 AM

## 2022-09-25 ENCOUNTER — Encounter: Payer: Commercial Managed Care - PPO | Admitting: Speech Pathology

## 2022-09-26 ENCOUNTER — Ambulatory Visit: Payer: Commercial Managed Care - PPO | Admitting: Speech Pathology

## 2022-09-26 DIAGNOSIS — R498 Other voice and resonance disorders: Secondary | ICD-10-CM | POA: Diagnosis not present

## 2022-09-26 NOTE — Therapy (Signed)
OUTPATIENT SPEECH LANGUAGE PATHOLOGY VOICE TREATMENT   Patient Name: Steven Velasquez MRN: [id number] DOB:19-Jun-1955, 67 y.o., male Today's Date: 09/26/2022  PCP: None provided REFERRING PROVIDER: Merri Brunette MD  END OF SESSION:  End of Session - 09/26/22 0754     Visit Number 3    Number of Visits 17    Date for SLP Re-Evaluation 11/11/22    Authorization Type [employer]/Medicare    SLP Start Time 0754    SLP Stop Time  0836    SLP Time Calculation (min) 42 min    Activity Tolerance Patient tolerated treatment well             No past medical history on file. No past surgical history on file. There are no problems to display for this patient.   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  this referral will be set up"   No ENT documentation found in our system but pt provided info via text from son-in-law  PAIN: Are you having pain? No  FALLS: Has patient fallen in last 6 months? No  PATIENT GOALS: "want to get my voice back"  OBJECTIVE:   TODAY'S TREATMENT:                                                                                                                                         09/26/22: Pt reports ongoing efforts to avoid unnecessary coughing and throat clearing. Endorses decreased coughing but sometimes productive cough persists. Addressed use of saline rinse to attempt reduction in reported post nasal drip symptoms. Generated list of  x6 potential opportunities to aid in mitigation of vocal abuse. Pt able to teach back use of diaphragmatic breathing and pacing, throat clear alternatives, and vocal warm ups with min-A from SLP to A in completeness and understanding of implementation. Modeled and practiced vocal function exercises, with usual cues for breath support, resulting in clear voicing throughout exercise. Initiated instruction for resonant voice with pt able to demonstrate increased awareness of pressed vs forward resonance.   09/23/22: SLP educated patient re: semi-occluded vocal tract exercises purpose and execution to reduce laryngeal tension and promote balanced and coordinated use of respiratory and phonatory sub-systems. SLP provided modeling and feedback throughout exercises, with pt able to achieve success at the song level. Straw in water was used to leverage biofeedback to optimize pt's understanding of performance. Updated HEP  to include SOVTE with handout provided. Introduced Company secretary, with pt able to demonstrate clear voicing during sustained phonation and pitch glide tasks. Verbal cues required to aid in breathing to avoid pressed voice at end of task. SLP required to bring awareness to habitual throat clears prior to voicing tasks, with pt endorsing belief it is d/t need to clear mucus. Encouraged pt to reach out to son in law to ask if mucus was present in throat during scoping.   09/16/22: Educated patient on 3 sub-systems of voice. Assessment revealed reduced breath support and more back focused phonation resulting in increasingly hoarse, rough vocal quality. Initiated instruction of abdominal breathing with demo and handout provided. Provided recommendations for throat clear alternatives given intermittent throat clearing exhibited today.   PATIENT EDUCATION: Education details: see above Person educated: Patient Education method: Explanation, Demonstration, and Handouts Education comprehension:  verbalized understanding, returned demonstration, and needs further education  GOALS: Goals reviewed with patient? Yes  SHORT TERM GOALS: Target date: 10/14/2022  Pt will complete recommended HEP 2/2 sessions Baseline: Goal status: in progress  2.  Pt will achieve clear vocal quality for 80% of trials during structured task x2 given rare min A  Baseline:  Goal status: in progress  3.  Pt will ID and attempt to correct hoarse voice for 80% of opportunities during structured conversation x2 given rare min A  Baseline:  Goal status: in progress  4.  Pt will implement throat clear alternatives to optimize vocal hygiene given rare min A by STG date Baseline:  Goal status: in progress  5.  Pt will maintain clear vocal quality for 10-15 minute unstructured conversation x2 given rare min A  Baseline:  Goal status: in progress  LONG TERM GOALS: Target date: 11/11/2022  Pt will maintain clear vocal quality during 30+ minute unstructured conversations x2 given rare min A  Baseline:  Goal status: in progress  2.  Pt will report successful carryover of learned techniques with clear vocal quality achieved/maintained for sermons x2  Baseline:  Goal status: in progress  3.  Pt will report improved voice quality via PROM by 2 points by LTG date Baseline: 13 Goal status: in progress   ASSESSMENT:  CLINICAL IMPRESSION: Patient is a 67 y.o. M who was seen today for dysphonia. Pt's son-in-law is an ENT in [place], who performed larygnoscopy with dx of muscle tension dysphonia. 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 More recently began using microphone to aid projection in church. Also reported inability to sing without pitch breaks. Endorsed hx of chronic cough, which resolved after terminating lisonpril. C/o frequent post nasal drip and intermittent sensation of mucous in larynx.  Occasional throat clearing and mild coughing noted during evaluation. Today, voice c/b intermittent increasing to usual hoarseness as result of reduced breath support, back focused phonation, and muscle tension in larynx. Demonstrated increased vocal clarity with cues for breath support and projection. Given impact on personal and professional voice use, pt would benefit from skilled ST intervention to optimize vocal efficiency for improved vocal quality.   OBJECTIVE IMPAIRMENTS: include voice disorder. These impairments are limiting patient from effectively communicating at home and in community. Factors affecting potential to achieve goals and functional outcome are  None . Patient will benefit from skilled SLP services to address above impairments and improve overall function.  REHAB POTENTIAL: Good  PLAN:  SLP FREQUENCY: 2x/week  SLP DURATION: 8 weeks  PLANNED INTERVENTIONS: Cueing hierachy, Internal/external aids, Functional tasks, SLP instruction and feedback, Compensatory strategies, and Patient/family education    Maia Breslow, CCC-SLP 09/26/2022, 7:55 AM

## 2022-09-29 ENCOUNTER — Ambulatory Visit: Payer: Commercial Managed Care - PPO

## 2022-09-29 DIAGNOSIS — R498 Other voice and resonance disorders: Secondary | ICD-10-CM

## 2022-09-29 NOTE — Patient Instructions (Signed)
Resonant Voice Therapy  1 Take a deep breath and produce a smooth, steady hum. Try to make a sound between "h" and "m". Feel for a buzz in the lips and nose.  2 Take a good belly breath and hum. Then move it gently into a vowel.   Hmmmeeee   Hmmmoooo   Hmmmohhh   Hmmmahhh   Hmmmyyyy   Hmmmayyy  3 Try moving from the hum into a single word. Keep humming into the word, it will sound like a chant.  Try Hmmmm +.. Money Monkey Mother Milk Musical Mountain Monster Mouth Meatballs Music Mystery Map  4 Now take the hum and same list of words. This time hum, then try a conversational tone with the word. It should still sound smooth, but not like a chant.  5 Fade the hum and just say the words. Keep using a smooth, easy voice. If you notice roughness or hoarseness returning use a hum to return to smooth voice.  6 Again, take the list of words above. Hum, and then combine two words from the list to make a phrase. Keep humming in both words to make the chant sound.  7 Try the same phrases in a conversational voice. Keep using your hum to cue yourself into smooth voice.  8 Fade the hum and just say the phrase. Once you master this, try to change the pitch slightly. Vary it up and down, but don't go too low.  9 Hum then say the following phrases and sentences. Start by using the chant speech, then fade and just say the sentence. Keep your smooth voice.  Meet me, Theron Arista, meet me.  Maura's a Dealer, poor Company secretary.  Manny's popping monsters.  Never on a Monday.(continue on with the rest of the week)  Number one, number two (continue to twenty).  Many moaning men--many, many moaning men.  Come away, come away, come.   Make room, make room---the king!  A mermaid sitting upon a stone, combing and combing her long green hair.  My mind is my own.  Marvis Moeller and miles of golden sand, meandering in a lazy motion.  Glimmer, glimmer, glitter, gleam.  Marry me, marry me, marry me!  The madman moans at  the moon.

## 2022-09-29 NOTE — Therapy (Signed)
OUTPATIENT SPEECH LANGUAGE PATHOLOGY VOICE TREATMENT   Patient Name: Steven Velasquez MRN: [id number] DOB:[birth year], 67 y.o., male Today's Date: 09/29/2022  PCP: None provided REFERRING PROVIDER: Merri Brunette MD  END OF SESSION:  End of Session - 09/29/22 0828     Visit Number 4    Number of Visits 17    Date for SLP Re-Evaluation 11/11/22    Authorization Type [employer]/Medicare    SLP Start Time 0845    SLP Stop Time  0930    SLP Time Calculation (min) 45 min    Activity Tolerance Patient tolerated treatment well             History reviewed. No pertinent past medical history. History reviewed. No pertinent surgical history. There are no problems to display for this patient.   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  this referral will be set up"   No ENT documentation found in our system but pt provided info via text from son-in-law  PAIN: Are you having pain? No  FALLS: Has patient fallen in last 6 months? No  PATIENT GOALS: "want to get my voice back"  OBJECTIVE:   TODAY'S TREATMENT:                                                                                                                                         09/29/22: Pt stated that he is experiencing seasonal allergy symptoms and has difficulty distinguishing whether it is affecting his voice quality. Reports doing 30 minutes of SOVTE per day. SLP advised to reduce  to 5 minutes a day to limit vocal fatigue. SLP provided direct instruction and modeling for completion of resonant voice therapy exercises to encourage forward resonance and decrease tension when voicing. Pt demonstrates exercises through the paragraph level, provided with occasional min-A from SLP. Pt benefitting from cues to reset with a breath and send his voice forward. Updated HEP to include sentences and short paragraphs.   09/26/22: Pt reports ongoing efforts to avoid unnecessary coughing and throat clearing. Endorses decreased coughing but sometimes productive cough persists. Addressed use of saline rinse to attempt reduction in reported post nasal drip symptoms. Generated list of x6 potential opportunities to aid in mitigation of vocal abuse. Pt able to teach back use of diaphragmatic breathing and pacing, throat clear alternatives, and vocal warm ups with min-A from  SLP to A in completeness and understanding of implementation. Modeled and practiced vocal function exercises, with usual cues for breath support, resulting in clear voicing throughout exercise. Initiated instruction for resonant voice with pt able to demonstrate increased awareness of pressed vs forward resonance.   09/23/22: SLP educated patient re: semi-occluded vocal tract exercises purpose and execution to reduce laryngeal tension and promote balanced and coordinated use of respiratory and phonatory sub-systems. SLP provided modeling and feedback throughout exercises, with pt able to achieve success at the song level. Straw in water was used to leverage biofeedback to optimize pt's understanding of performance. Updated HEP to include SOVTE with handout provided. Introduced Company secretary, with pt able to demonstrate clear voicing during sustained phonation and pitch glide tasks. Verbal cues required to aid in breathing to avoid pressed voice at end of task. SLP required to bring awareness to habitual throat clears prior to voicing  tasks, with pt endorsing belief it is d/t need to clear mucus. Encouraged pt to reach out to son in law to ask if mucus was present in throat during scoping.   09/16/22: Educated patient on 3 sub-systems of voice. Assessment revealed reduced breath support and more back focused phonation resulting in increasingly hoarse, rough vocal quality. Initiated instruction of abdominal breathing with demo and handout provided. Provided recommendations for throat clear alternatives given intermittent throat clearing exhibited today.   PATIENT EDUCATION: Education details: see above Person educated: Patient Education method: Explanation, Demonstration, and Handouts Education comprehension: verbalized understanding, returned demonstration, and needs further education  GOALS: Goals reviewed with patient? Yes  SHORT TERM GOALS: Target date: 10/14/2022  Pt will complete recommended HEP 2/2 sessions Baseline: 09-29-22 Goal status: in progress  2.  Pt will achieve clear vocal quality for 80% of trials during structured task x2 given rare min A  Baseline: 09-29-22 Goal status: in progress  3.  Pt will ID and attempt to correct hoarse voice for 80% of opportunities during structured conversation x2 given rare min A  Baseline:  Goal status: in progress  4.  Pt will implement throat clear alternatives to optimize vocal hygiene given rare min A by STG date Baseline:  Goal status: in progress  5.  Pt will maintain clear vocal quality for 10-15 minute unstructured conversation x2 given rare min A  Baseline:  Goal status: in progress  LONG TERM GOALS: Target date: 11/11/2022  Pt will maintain clear vocal quality during 30+ minute unstructured conversations x2 given rare min A  Baseline:  Goal status: in progress  2.  Pt will report successful carryover of learned techniques with clear vocal quality achieved/maintained for sermons x2  Baseline:  Goal status: in progress  3.  Pt will report improved voice  quality via PROM by 2 points by LTG date Baseline: 13 Goal status: in progress   ASSESSMENT:  CLINICAL IMPRESSION: Patient is a 67 y.o. M who was seen today for dysphonia. Pt's son-in-law is an ENT in [place], who performed larygnoscopy with dx of muscle tension dysphonia. 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 More recently began using microphone to aid projection in church. Also reported inability to sing without pitch breaks. Endorsed hx of chronic cough, which resolved after terminating lisonpril. C/o frequent post nasal drip and intermittent sensation of mucous in larynx. Rare throat clearing and mild coughing noted during tx. Today, voice c/b intermittent hoarseness as result of reduced breath support, back  focused phonation, and muscle tension in larynx, with pt exhibiting increased awareness of back focused phonation with rare min A. Demonstrated increased vocal clarity at paragraph level with cues for breath support and projection. Given impact on personal and professional voice use, pt would benefit from skilled ST intervention to optimize vocal efficiency for improved vocal quality.   OBJECTIVE IMPAIRMENTS: include voice disorder. These impairments are limiting patient from effectively communicating at home and in community. Factors affecting potential to achieve goals and functional outcome are  None . Patient will benefit from skilled SLP services to address above impairments and improve overall function.  REHAB POTENTIAL: Good  PLAN:  SLP FREQUENCY: 2x/week  SLP DURATION: 8 weeks  PLANNED INTERVENTIONS: Cueing hierachy, Internal/external aids, Functional tasks, SLP instruction and feedback, Compensatory strategies, and Patient/family education    Delany Loonam, Student-SLP 09/29/2022, 11:03 AM

## 2022-10-01 ENCOUNTER — Ambulatory Visit: Payer: Commercial Managed Care - PPO | Admitting: Speech Pathology

## 2022-10-01 ENCOUNTER — Encounter: Payer: Self-pay | Admitting: Speech Pathology

## 2022-10-01 DIAGNOSIS — R498 Other voice and resonance disorders: Secondary | ICD-10-CM | POA: Diagnosis not present

## 2022-10-01 NOTE — Therapy (Signed)
OUTPATIENT SPEECH LANGUAGE PATHOLOGY VOICE TREATMENT   Patient Name: Steven Velasquez MRN: [id number] DOB:21-Jun-1955, 67 y.o., male Today's Date: 10/01/2022  PCP: None provided REFERRING PROVIDER: Merri Brunette MD  END OF SESSION:  End of Session - 10/01/22 0838     Visit Number 5    Number of Visits 17    Date for SLP Re-Evaluation 11/11/22    Authorization Type [employer]/Medicare    SLP Start Time 0845    SLP Stop Time  0930    SLP Time Calculation (min) 45 min    Activity Tolerance Patient tolerated treatment well             History reviewed. No pertinent past medical history. History reviewed. No pertinent surgical history. There are no problems to display for this patient.   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  this referral will be set up"   No ENT documentation found in our system but pt provided info via text from son-in-law  PAIN: Are you having pain? No  FALLS: Has patient fallen in last 6 months? No  PATIENT GOALS: "Yesterday, I didn't talk at all"  OBJECTIVE:   TODAY'S TREATMENT:                                                                                                                                          10/01/22: Pt enters with WNL voice.Steven Velasquez reports completing SOVTE and resonant voice exercises consistently. He demonstrated resonant voice HEP with rare min A to slightly prolong "m" to maximize  forward phonation. In structured task generating sentences using resonant carrier phrase, Steven Velasquez maintained clear phonation 20/20 utterances. Targeted awareness and self correction of dysphonia alternating negative practice with clear phonation - Steven Velasquez self corrected with rare min A. In conversation he required visual cues (pointing to my throat) to ID and correct fry voice. Added flow phonation sentences to HEP as another tool to  maintain clear phonation. Pt demonstrated flow phrases with rare min A. 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 He is going on vacation and will return 9/23 to speech therapy.   09/29/22: Pt stated that he is experiencing seasonal allergy symptoms and  has difficulty distinguishing whether it is affecting his voice quality. Reports doing 30 minutes of SOVTE per day. SLP advised to reduce to 5 minutes a day to limit vocal fatigue. SLP provided direct instruction and modeling for completion of resonant voice therapy exercises to encourage forward resonance and decrease tension when voicing. Pt demonstrates exercises through the paragraph level, provided with occasional min-A from SLP. Pt benefitting from cues to reset with a breath and send his voice forward. Updated HEP to include sentences and short paragraphs.   09/26/22: Pt reports ongoing efforts to avoid unnecessary coughing and throat clearing. Endorses decreased coughing but sometimes productive cough persists. Addressed use of saline rinse to attempt reduction in reported post nasal drip symptoms. Generated list of x6 potential opportunities to aid in mitigation of vocal abuse. Pt able to teach back use of diaphragmatic breathing and pacing, throat clear alternatives, and vocal warm ups with min-A from SLP to A in completeness and understanding of implementation. Modeled and  practiced vocal function exercises, with usual cues for breath support, resulting in clear voicing throughout exercise. Initiated instruction for resonant voice with pt able to demonstrate increased awareness of pressed vs forward resonance.   09/23/22: SLP educated patient re: semi-occluded vocal tract exercises purpose and execution to reduce laryngeal tension and promote balanced and coordinated use of respiratory and phonatory sub-systems. SLP provided modeling and feedback throughout exercises, with pt able to achieve success at the song level. Straw in water was used to leverage biofeedback to optimize pt's understanding of performance. Updated HEP to include SOVTE with handout provided. Introduced Steven Velasquez, with pt able to demonstrate clear voicing during sustained phonation and pitch glide tasks. Verbal cues required to aid in breathing to avoid pressed voice at end of task. SLP required to bring awareness to habitual throat clears prior to voicing tasks, with pt endorsing belief it is d/t need to clear mucus. Encouraged pt to reach out to son in law to ask if mucus was present in throat during scoping.   09/16/22: Educated patient on 3 sub-systems of voice. Assessment revealed reduced breath support and more back focused phonation resulting in increasingly hoarse, rough vocal quality. Initiated instruction of abdominal breathing with demo and handout provided. Provided recommendations for throat clear alternatives given intermittent throat clearing exhibited today.   PATIENT EDUCATION: Education details: see above Person educated: Patient Education method: Explanation, Demonstration, and Handouts Education comprehension: verbalized understanding, returned demonstration, and needs further education  GOALS: Goals reviewed with patient? Yes  SHORT TERM GOALS: Target date: 10/14/2022  Pt will complete recommended HEP 2/2 sessions Baseline: 09-29-22 Goal status: in progress  2.  Pt  will achieve clear vocal quality for 80% of trials during structured task x2 given rare min A  Baseline: 09-29-22 Goal status: in progress  3.  Pt will ID and attempt to correct hoarse voice for 80% of opportunities during structured conversation x2 given rare min A  Baseline:  Goal status: in progress  4.  Pt will implement throat clear alternatives to optimize vocal hygiene given rare min A by STG date Baseline:  Goal status: in progress  5.  Pt will maintain clear vocal quality for 10-15 minute unstructured conversation x2 given rare min A  Baseline:  Goal status: in progress  LONG TERM GOALS: Target date: 11/11/2022  Pt will maintain clear vocal quality during 30+ minute unstructured conversations x2 given rare min A  Baseline:  Goal status: in progress  2.  Pt will report  successful carryover of learned techniques with clear vocal quality achieved/maintained for sermons x2  Baseline:  Goal status: in progress  3.  Pt will report improved voice quality via PROM by 2 points by LTG date Baseline: 13 Goal status: in progress   ASSESSMENT:  CLINICAL IMPRESSION: Patient is a 67 y.o. M who was seen today for dysphonia. Pt's son-in-law is an ENT in [place], who performed larygnoscopy with dx of muscle tension dysphonia. 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 More recently began using microphone to aid projection in church. Also reported inability to sing without pitch breaks. Endorsed hx of chronic cough, which resolved after terminating lisonpril. C/o frequent post nasal drip and intermittent sensation of mucous in larynx. Rare throat clearing and mild coughing noted during tx. Today, voice c/b intermittent hoarseness as result of reduced breath support, back focused phonation, and muscle tension in larynx, with pt exhibiting increased awareness of back focused phonation with  rare min A. Demonstrated increased vocal clarity at paragraph level with cues for breath support and projection. Given impact on personal and professional voice use, pt would benefit from skilled ST intervention to optimize vocal efficiency for improved vocal quality.   OBJECTIVE IMPAIRMENTS: include voice disorder. These impairments are limiting patient from effectively communicating at home and in community. Factors affecting potential to achieve goals and functional outcome are  None . Patient will benefit from skilled SLP services to address above impairments and improve overall function.  REHAB POTENTIAL: Good  PLAN:  SLP FREQUENCY: 2x/week  SLP DURATION: 8 weeks  PLANNED INTERVENTIONS: Cueing hierachy, Internal/external aids, Functional tasks, SLP instruction and feedback, Compensatory strategies, and Patient/family education    Alice Reichert Radene Journey, CCC-SLP 10/01/2022, 9:28 AM

## 2022-10-13 ENCOUNTER — Ambulatory Visit: Payer: Commercial Managed Care - PPO

## 2022-10-13 DIAGNOSIS — R498 Other voice and resonance disorders: Secondary | ICD-10-CM

## 2022-10-13 NOTE — Therapy (Signed)
OUTPATIENT SPEECH LANGUAGE PATHOLOGY VOICE TREATMENT   Patient Name: Steven Velasquez MRN: [id number] DOB:August 17, 1955, 67 y.o., male Today's Date: 10/13/2022  PCP: None provided REFERRING PROVIDER: Merri Brunette MD  END OF SESSION:  End of Session - 10/13/22 0800     Visit Number 6    Number of Visits 17    Date for SLP Re-Evaluation 11/11/22    Authorization Type [employer]/Medicare    SLP Start Time 0800    SLP Stop Time  0845    SLP Time Calculation (min) 45 min    Activity Tolerance Patient tolerated treatment well             History reviewed. No pertinent past medical history. History reviewed. No pertinent surgical history. There are no problems to display for this patient.   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  this referral will be set up"   No ENT documentation found in our system but pt provided info via text from son-in-law  PAIN: Are you having pain? No  FALLS: Has patient fallen in last 6 months? No  PATIENT GOALS: "Yesterday, I didn't talk at all"  OBJECTIVE:   TODAY'S TREATMENT:                                                                                                                                          10/13/22: Entered with WNL vocal quality. Endorsed clear voicing across x2 sermons. Increased awareness of forward vs pressed voice, demonstrated today by maintaining clear voicing during a 30 minute  conversation with rare min A. Pt stated that using learned strategies, such as increasing breath support and reducing the amount of words produced on exhale, have contributed to improved voicing. Completing HEP daily as recommended. SLP discussed reducing sessions to 1x a week due to progress and pt agreed.  10/01/22: Pt enters with WNL voice.Anderson reports completing SOVTE and resonant voice exercises consistently. He demonstrated resonant voice HEP with rare min A to slightly prolong "m" to maximize forward phonation. In structured task generating sentences using resonant carrier phrase, Bryer maintained clear phonation 20/20 utterances. Targeted awareness and self correction of dysphonia alternating negative practice with clear phonation - Rogers self corrected with rare min A. In conversation he required visual cues (pointing to my throat) to ID and correct fry voice. Added flow phonation sentences to HEP  as another tool to  maintain clear phonation. Pt demonstrated flow phrases with rare min A. 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 He is going on vacation and will return 9/23 to speech therapy.   09/29/22: Pt stated that he is experiencing seasonal allergy symptoms and has difficulty distinguishing whether it is affecting his voice quality. Reports doing 30 minutes of SOVTE per day. SLP advised to reduce to 5 minutes a day to limit vocal fatigue. SLP provided direct instruction and modeling for completion of resonant voice therapy exercises to encourage forward resonance and decrease tension when voicing. Pt demonstrates exercises through the paragraph level, provided with occasional min-A from SLP. Pt benefitting from cues to reset with a breath and send his voice forward. Updated HEP to include sentences and short paragraphs.   09/26/22: Pt reports  ongoing efforts to avoid unnecessary coughing and throat clearing. Endorses decreased coughing but sometimes productive cough persists. Addressed use of saline rinse to attempt reduction in reported post nasal drip symptoms. Generated list of x6 potential opportunities to aid in mitigation of vocal abuse. Pt able to teach back use of diaphragmatic breathing and pacing, throat clear alternatives, and vocal warm ups with min-A from SLP to A in completeness and understanding of implementation. Modeled and practiced vocal function exercises, with usual cues for breath support, resulting in clear voicing throughout exercise. Initiated instruction for resonant voice with pt able to demonstrate increased awareness of pressed vs forward resonance.   09/23/22: SLP educated patient re: semi-occluded vocal tract exercises purpose and execution to reduce laryngeal tension and promote balanced and coordinated use of respiratory and phonatory sub-systems. SLP provided modeling and feedback throughout exercises, with pt able to achieve success at the song level. Straw in water was used to leverage biofeedback to optimize pt's understanding of performance. Updated HEP to include SOVTE with handout provided. Introduced [employer], with pt able to demonstrate clear voicing during sustained phonation and pitch glide tasks. Verbal cues required to aid in breathing to avoid pressed voice at end of task. SLP required to bring awareness to habitual throat clears prior to voicing tasks, with pt endorsing belief it is d/t need to clear mucus. Encouraged pt to reach out to son in law to ask if mucus was present in throat during scoping.   09/16/22: Educated patient on 3 sub-systems of voice. Assessment revealed reduced breath support and more back focused phonation resulting in increasingly hoarse, rough vocal quality. Initiated instruction of abdominal breathing with demo and handout provided. Provided recommendations for  throat clear alternatives given intermittent throat clearing exhibited today.   PATIENT EDUCATION: Education details: see above Person educated: Patient Education method: Explanation, Demonstration, and Handouts Education comprehension: verbalized understanding, returned demonstration, and needs further education  GOALS: Goals reviewed with patient? Yes  SHORT TERM GOALS: Target date: 10/14/2022  Pt will complete recommended HEP 2/2 sessions Baseline: 09-29-22, 10-13-22 Goal status: MET  2.  Pt will achieve clear vocal quality for 80% of trials during structured task x2 given rare min A  Baseline: 09-29-22, 10-01-22 Goal status: MET  3.  Pt will ID and attempt to correct hoarse voice for 80% of opportunities during structured conversation x2 given rare min A  Baseline: 10-01-22, 10-13-22 Goal status: MET  4.  Pt will implement throat clear alternatives to  optimize vocal hygiene given rare min A by STG date Baseline: 0 Goal status: MET  5.  Pt will maintain clear vocal quality for 10-15 minute unstructured conversation x2 given rare min A  Baseline:  Goal status: MET  LONG TERM GOALS: Target date: 11/11/2022  Pt will maintain clear vocal quality during 30+ minute unstructured conversations x2 given rare min A  Baseline:  Goal status: in progress  2.  Pt will report successful carryover of learned techniques with clear vocal quality achieved/maintained for sermons x2  Baseline:  Goal status: in progress  3.  Pt will report improved voice quality via PROM by 2 points by LTG date Baseline: 13 Goal status: in progress   ASSESSMENT:  CLINICAL IMPRESSION: Patient is a 67 y.o. M who was seen today for dysphonia. Pt's son-in-law is an ENT in [place], who performed larygnoscopy with dx of muscle tension dysphonia. Subjective improvements in voice reported over last week, including clear voice maintained throughout vacation and during sermons x2. Today, voice c/b overall clear vocal quality  with rare raspy quality, which pt self-corrected with trained techniques. Demonstrated increased awareness and carryover of increased reduced breath support, forward focused phonation, and completion of HEP. Decreased to 1x/week d/t significant progress. Given impact on personal and professional voice use, pt would benefit from skilled ST intervention to optimize vocal efficiency for improved vocal quality.   OBJECTIVE IMPAIRMENTS: include voice disorder. These impairments are limiting patient from effectively communicating at home and in community. Factors affecting potential to achieve goals and functional outcome are  None . Patient will benefit from skilled SLP services to address above impairments and improve overall function.  REHAB POTENTIAL: Good  PLAN:  SLP FREQUENCY: 2x/week  SLP DURATION: 8 weeks  PLANNED INTERVENTIONS: Cueing hierachy, Internal/external aids, Functional tasks, SLP instruction and feedback, Compensatory strategies, and Patient/family education    Gracy Racer, CCC-SLP 10/13/2022, 10:33 AM

## 2022-10-20 ENCOUNTER — Ambulatory Visit: Payer: Commercial Managed Care - PPO

## 2022-10-20 DIAGNOSIS — R498 Other voice and resonance disorders: Secondary | ICD-10-CM | POA: Diagnosis not present

## 2022-10-20 NOTE — Therapy (Signed)
OUTPATIENT SPEECH LANGUAGE PATHOLOGY VOICE TREATMENT   Patient Name: Steven Velasquez MRN: [id number] DOB:Jan 10, 1956, 67 y.o., male Today's Date: 10/20/2022  PCP: None provided REFERRING PROVIDER: Merri Brunette MD  END OF SESSION:  End of Session - 10/20/22 0802     Visit Number 7    Number of Visits 17    Date for SLP Re-Evaluation 11/11/22    Authorization Type [employer]/Medicare    SLP Start Time 0802    SLP Stop Time  0849    SLP Time Calculation (min) 47 min    Activity Tolerance Patient tolerated treatment well             History reviewed. No pertinent past medical history. History reviewed. No pertinent surgical history. There are no problems to display for this patient.   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  this referral will be set up"   No ENT documentation found in our system but pt provided info via text from son-in-law  PAIN: Are you having pain? No  FALLS: Has patient fallen in last 6 months? No  PATIENT GOALS: "Yesterday, I didn't talk at all"  OBJECTIVE:   TODAY'S TREATMENT:                                                                                                                                          10/20/22: Entered with slightly hoarse voicing which improved after pt demonstrated SOVT exercises independently. 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 Maintained improved vocal quality during 30 minute unstructured conversation with self-correction of pressed voice with mod I. Pt stated that he has improved awareness of voice quality during conversations outside of ST and self-corrects using trained techniques. Noted that family and friends have affirmed recent improvements in vocal quality as well. Consider d/c next session if progress continues.   10/13/22: Entered with WNL vocal quality. Endorsed clear voicing across x2 sermons. Increased awareness of forward vs pressed voice, demonstrated today by maintaining clear voicing during a 30 minute conversation with rare min A. Pt stated that using learned strategies, such as increasing breath support and reducing the amount of words produced on exhale, have contributed to improved voicing. Completing HEP daily as recommended. SLP discussed reducing sessions to 1x a week due to progress and  pt agreed.  10/01/22: Pt enters with WNL voice.Laterrance reports completing SOVTE and resonant voice exercises consistently. He demonstrated resonant voice HEP with rare min A to slightly prolong "m" to maximize forward phonation. In structured task generating sentences using resonant carrier phrase, Derrian maintained clear phonation 20/20 utterances. Targeted awareness and self correction of dysphonia alternating negative practice with clear phonation - Daylan self corrected with rare min A. In conversation he required visual cues (pointing to my throat) to ID and correct fry voice. Added flow phonation sentences to HEP as another tool to  maintain clear phonation. Pt demonstrated flow phrases with rare min A. 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 He is going on vacation and will return 9/23 to speech therapy.   09/29/22: Pt stated that he is experiencing  seasonal allergy symptoms and has difficulty distinguishing whether it is affecting his voice quality. Reports doing 30 minutes of SOVTE per day. SLP advised to reduce to 5 minutes a day to limit vocal fatigue. SLP provided direct instruction and modeling for completion of resonant voice therapy exercises to encourage forward resonance and decrease tension when voicing. Pt demonstrates exercises through the paragraph level, provided with occasional min-A from SLP. Pt benefitting from cues to reset with a breath and send his voice forward. Updated HEP to include sentences and short paragraphs.   09/26/22: Pt reports ongoing efforts to avoid unnecessary coughing and throat clearing. Endorses decreased coughing but sometimes productive cough persists. Addressed use of saline rinse to attempt reduction in reported post nasal drip symptoms. Generated list of x6 potential opportunities to aid in mitigation of vocal abuse. Pt able to teach back use of diaphragmatic breathing and pacing, throat clear alternatives, and vocal warm ups with min-A from SLP to A in completeness and understanding of implementation. Modeled and practiced vocal function exercises, with usual cues for breath support, resulting in clear voicing throughout exercise. Initiated instruction for resonant voice with pt able to demonstrate increased awareness of pressed vs forward resonance.   09/23/22: SLP educated patient re: semi-occluded vocal tract exercises purpose and execution to reduce laryngeal tension and promote balanced and coordinated use of respiratory and phonatory sub-systems. SLP provided modeling and feedback throughout exercises, with pt able to achieve success at the song level. Straw in water was used to leverage biofeedback to optimize pt's understanding of performance. Updated HEP to include SOVTE with handout provided. Introduced Company secretary, with pt able to demonstrate clear voicing during sustained phonation and pitch  glide tasks. Verbal cues required to aid in breathing to avoid pressed voice at end of task. SLP required to bring awareness to habitual throat clears prior to voicing tasks, with pt endorsing belief it is d/t need to clear mucus. Encouraged pt to reach out to son in law to ask if mucus was present in throat during scoping.   09/16/22: Educated patient on 3 sub-systems of voice. Assessment revealed reduced breath support and more back focused phonation resulting in increasingly hoarse, rough vocal quality. Initiated instruction of abdominal breathing with demo and handout provided. Provided recommendations for throat clear alternatives given intermittent throat clearing exhibited today.   PATIENT EDUCATION: Education details: see above Person educated: Patient Education method: Explanation, Demonstration, and Handouts Education comprehension: verbalized understanding, returned demonstration, and needs further education  GOALS: Goals reviewed with  patient? Yes  SHORT TERM GOALS: Target date: 10/14/2022  Pt will complete recommended HEP 2/2 sessions Baseline: 09-29-22, 10-13-22 Goal status: MET  2.  Pt will achieve clear vocal quality for 80% of trials during structured task x2 given rare min A  Baseline: 09-29-22, 10-01-22 Goal status: MET  3.  Pt will ID and attempt to correct hoarse voice for 80% of opportunities during structured conversation x2 given rare min A  Baseline: 10-01-22, 10-13-22 Goal status: MET  4.  Pt will implement throat clear alternatives to optimize vocal hygiene given rare min A by STG date Baseline: 0 Goal status: MET  5.  Pt will maintain clear vocal quality for 10-15 minute unstructured conversation x2 given rare min A  Baseline:  Goal status: MET  LONG TERM GOALS: Target date: 11/11/2022  Pt will maintain clear vocal quality during 30+ minute unstructured conversations x2 given rare min A  Baseline:  Goal status: MET  2.  Pt will report successful carryover of  learned techniques with clear vocal quality achieved/maintained for sermons x2  Baseline:  Goal status: MET  3.  Pt will report improved voice quality via PROM by 2 points by LTG date Baseline: 13 Goal status: in progress   ASSESSMENT:  CLINICAL IMPRESSION: Patient is a 67 y.o. M who was seen today for dysphonia. Pt's son-in-law is an ENT in [place], who performed larygnoscopy with dx of muscle tension dysphonia. Subjective improvements in voice reported, including clear voice maintained throughout sermons and family affirming improvements. Today, voice c/b initial raspy quality that improved to clear vocal quality after SOVTE warm-up. Pt self-corrected multiple times within the session using trained techniques. Demonstrated increased awareness and carryover of increased breath support, forward focused phonation, and completion of HEP. Decreased to 1x/week d/t significant progress. Given impact on personal and professional voice use, pt would benefit from skilled ST intervention to optimize vocal efficiency for improved vocal quality.   OBJECTIVE IMPAIRMENTS: include voice disorder. These impairments are limiting patient from effectively communicating at home and in community. Factors affecting potential to achieve goals and functional outcome are  None . Patient will benefit from skilled SLP services to address above impairments and improve overall function.  REHAB POTENTIAL: Good  PLAN:  SLP FREQUENCY: 2x/week  SLP DURATION: 8 weeks  PLANNED INTERVENTIONS: Cueing hierachy, Internal/external aids, Functional tasks, SLP instruction and feedback, Compensatory strategies, and Patient/family education    Whitney Muse, Student-SLP 10/20/2022, 8:49 AM

## 2022-10-22 ENCOUNTER — Encounter: Payer: Commercial Managed Care - PPO | Admitting: Speech Pathology

## 2022-10-27 ENCOUNTER — Other Ambulatory Visit (HOSPITAL_COMMUNITY): Payer: Self-pay

## 2022-10-27 ENCOUNTER — Ambulatory Visit: Payer: Commercial Managed Care - PPO | Attending: Family Medicine

## 2022-10-27 DIAGNOSIS — J383 Other diseases of vocal cords: Secondary | ICD-10-CM | POA: Diagnosis present

## 2022-10-27 DIAGNOSIS — R498 Other voice and resonance disorders: Secondary | ICD-10-CM | POA: Insufficient documentation

## 2022-10-27 MED ORDER — INFLUENZA VAC A&B SURF ANT ADJ 0.5 ML IM SUSY
0.5000 mL | PREFILLED_SYRINGE | Freq: Once | INTRAMUSCULAR | 0 refills | Status: AC
  Filled 2022-10-27: qty 0.5, 1d supply, fill #0

## 2022-10-27 NOTE — Therapy (Signed)
OUTPATIENT SPEECH LANGUAGE PATHOLOGY VOICE TREATMENT (DISCHARGE)   Patient Name: Steven Velasquez MRN: [id number] DOB:1955/05/22, 67 y.o., male Today's Date: 10/27/2022  PCP: None provided REFERRING PROVIDER: Merri Brunette MD  END OF SESSION:  End of Session - 10/27/22 0755     Visit Number 8    Number of Visits 17    Date for SLP Re-Evaluation 11/11/22    Authorization Type [employer]/Medicare    SLP Start Time 0802    SLP Stop Time  0840    SLP Time Calculation (min) 38 min    Activity Tolerance Patient tolerated treatment well            SPEECH THERAPY DISCHARGE SUMMARY  Visits from Start of Care: 8  Current functional level related to goals / functional outcomes: Overall improvements in vocal quality exhibited with rare hoarseness noted. Endorses carryover of clear voicing and strategies outside of therapy. Agreeable to discharge today given good progress.    Remaining deficits: Intermittent hoarseness   Education / Equipment: Throat clear alternatives, resonant voice, SOVTE, abdominal breathing   Patient agrees to discharge. Patient goals were met. Patient is being discharged due to meeting the stated rehab goals.    History reviewed. No pertinent past medical history. History reviewed. No pertinent surgical history. There are no problems to display for this patient.   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  this referral will be set up"   No ENT documentation found in our system but pt provided info via text from son-in-law  PAIN: Are you having pain? No  FALLS: Has patient fallen in last 6 months? No  PATIENT GOALS: "Yesterday, I didn't talk at all"  OBJECTIVE:   TODAY'S TREATMENT:                                                                                                                                          10/27/22: Entered with WNL vocal quality. During 30 minute unstructured conversation, pt exhibited intermittent pressed voice. Demonstrated attempts to clear voice quality through increasing breath support and taking sips of water given rare min A. Reported completing ongoing SOVT and abdominal breathing HEP. Also reported clear voicing for yesterday's sermons x2. Pt taught back strategies learned in ST (take a breath, project voice, sip of water), indicating a strong understanding of  how to improve vocal quality in instances of pressed voice. Re-administered V-RQOL PROM with pt scoring 13 (same as initial eval). Ranked 2, a small problem, for running out of air and needing to take frequent breaths while talking, sometimes not knowing what will come out when I begin speaking, and have trouble doing my job because of my voice. Although PROM score remained the same, pt stated that he is confident using strategies in areas he indicated were still a small problem. Self-rated having clear voice 50% of time prior to beginning ST to having clear voice 95% of time today. Pt was agreeable to discharge given good progress.   10/20/22: Entered with slightly hoarse voicing which improved after pt demonstrated SOVT exercises independently. 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 Maintained improved vocal quality during 30 minute unstructured conversation with self-correction of pressed voice with mod I. Pt stated that he has improved awareness of voice quality during conversations outside  of ST and self-corrects using trained techniques. Noted that family and friends have affirmed recent improvements in vocal quality as well. Consider d/c next session if progress continues.   10/13/22: Entered with WNL vocal quality. Endorsed clear voicing across x2 sermons. Increased awareness of forward vs pressed voice, demonstrated today by maintaining clear voicing during a 30 minute conversation with rare min A. Pt stated that using learned strategies, such as increasing breath support and reducing the amount of words produced on exhale, have contributed to improved voicing. Completing HEP daily as recommended. SLP discussed reducing sessions to 1x a week due to progress and pt agreed.  10/01/22: Pt enters with WNL voice.Kieon reports completing SOVTE and resonant voice exercises consistently. He demonstrated resonant voice HEP with rare min A to slightly prolong "m" to maximize forward phonation. In structured task generating sentences using resonant carrier phrase, Kelley maintained clear phonation 20/20 utterances. Targeted awareness and self correction of dysphonia alternating negative practice with clear phonation - Keishon self corrected with rare min A. In conversation he required visual cues (pointing to my throat) to ID and correct fry voice. Added flow phonation sentences to HEP as another tool to  maintain clear phonation. Pt demonstrated flow phrases with rare min A. 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 He is going on vacation and will return 9/23 to speech therapy.   09/29/22: Pt stated that he is experiencing seasonal allergy symptoms and has difficulty distinguishing whether it is affecting his voice quality. Reports doing 30 minutes of SOVTE per day. SLP advised to reduce to 5 minutes a day to limit vocal fatigue. SLP provided direct  instruction and modeling for completion of resonant voice therapy exercises to encourage forward resonance and decrease tension when voicing. Pt demonstrates exercises through the paragraph level, provided with occasional min-A from SLP. Pt benefitting from cues to reset with a breath and send his voice forward. Updated HEP to include sentences and short paragraphs.   09/26/22: Pt reports ongoing efforts to avoid unnecessary coughing and throat clearing. Endorses decreased coughing but sometimes productive cough persists. Addressed use of saline rinse to attempt reduction in reported post nasal drip symptoms. Generated list of x6 potential opportunities to aid in mitigation of vocal abuse. Pt able to teach back use of diaphragmatic breathing and pacing, throat clear alternatives, and vocal warm ups with min-A from SLP  to A in completeness and understanding of implementation. Modeled and practiced vocal function exercises, with usual cues for breath support, resulting in clear voicing throughout exercise. Initiated instruction for resonant voice with pt able to demonstrate increased awareness of pressed vs forward resonance.   09/23/22: SLP educated patient re: semi-occluded vocal tract exercises purpose and execution to reduce laryngeal tension and promote balanced and coordinated use of respiratory and phonatory sub-systems. SLP provided modeling and feedback throughout exercises, with pt able to achieve success at the song level. Straw in water was used to leverage biofeedback to optimize pt's understanding of performance. Updated HEP to include SOVTE with handout provided. Introduced Company secretary, with pt able to demonstrate clear voicing during sustained phonation and pitch glide tasks. Verbal cues required to aid in breathing to avoid pressed voice at end of task. SLP required to bring awareness to habitual throat clears prior to voicing tasks, with pt endorsing belief it is d/t need to clear mucus.  Encouraged pt to reach out to son in law to ask if mucus was present in throat during scoping.   09/16/22: Educated patient on 3 sub-systems of voice. Assessment revealed reduced breath support and more back focused phonation resulting in increasingly hoarse, rough vocal quality. Initiated instruction of abdominal breathing with demo and handout provided. Provided recommendations for throat clear alternatives given intermittent throat clearing exhibited today.   PATIENT EDUCATION: Education details: see above Person educated: Patient Education method: Explanation, Demonstration, and Handouts Education comprehension: verbalized understanding, returned demonstration, and needs further education  GOALS: Goals reviewed with patient? Yes  SHORT TERM GOALS: Target date: 10/14/2022  Pt will complete recommended HEP 2/2 sessions Baseline: 09-29-22, 10-13-22 Goal status: MET  2.  Pt will achieve clear vocal quality for 80% of trials during structured task x2 given rare min A  Baseline: 09-29-22, 10-01-22 Goal status: MET  3.  Pt will ID and attempt to correct hoarse voice for 80% of opportunities during structured conversation x2 given rare min A  Baseline: 10-01-22, 10-13-22 Goal status: MET  4.  Pt will implement throat clear alternatives to optimize vocal hygiene given rare min A by STG date Baseline: 0 Goal status: MET  5.  Pt will maintain clear vocal quality for 10-15 minute unstructured conversation x2 given rare min A  Baseline:  Goal status: MET  LONG TERM GOALS: Target date: 11/11/2022  Pt will maintain clear vocal quality during 30+ minute unstructured conversations x2 given rare min A  Baseline:  Goal status: MET  2.  Pt will report successful carryover of learned techniques with clear vocal quality achieved/maintained for sermons x2  Baseline:  Goal status: MET  3.  Pt will report improved voice quality via PROM by 2 points by LTG date Baseline: 13; 13 Goal status: PARTIALLY  MET   ASSESSMENT:  CLINICAL IMPRESSION: Patient is a 67 y.o. M who was seen today for dysphonia. Pt's son-in-law is an ENT in [place], who performed larygnoscopy with dx of muscle tension dysphonia. Subjective improvements in voice reported, including clear voice maintained throughout sermons and family affirming improvements. Today, voice c/b clear vocal quality with ability to self-correct intermittent hoarseness. Demonstrated increased awareness and understanding of learned strategies by teaching back how to optimize vocal quality. Pt agreeable to discharge today.   OBJECTIVE IMPAIRMENTS: include voice disorder. These impairments are limiting patient from effectively communicating at home and in community. Factors affecting potential to achieve goals and functional outcome are  None . Patient will benefit from  skilled SLP services to address above impairments and improve overall function.  REHAB POTENTIAL: Good  PLAN:  SLP FREQUENCY: 2x/week  SLP DURATION: 8 weeks  PLANNED INTERVENTIONS: Cueing hierachy, Internal/external aids, Functional tasks, SLP instruction and feedback, Compensatory strategies, and Patient/family education    Gracy Racer, CCC-SLP 10/27/2022, 9:15 AM

## 2022-10-29 ENCOUNTER — Encounter: Payer: Commercial Managed Care - PPO | Admitting: Speech Pathology

## 2022-11-03 ENCOUNTER — Encounter: Payer: Commercial Managed Care - PPO | Admitting: Speech Pathology

## 2022-11-06 ENCOUNTER — Encounter: Payer: Commercial Managed Care - PPO | Admitting: Speech Pathology

## 2022-11-13 ENCOUNTER — Other Ambulatory Visit (HOSPITAL_COMMUNITY): Payer: Self-pay

## 2022-11-13 ENCOUNTER — Other Ambulatory Visit: Payer: Self-pay

## 2022-11-17 ENCOUNTER — Other Ambulatory Visit (HOSPITAL_COMMUNITY): Payer: Self-pay

## 2022-11-21 ENCOUNTER — Other Ambulatory Visit (HOSPITAL_COMMUNITY): Payer: Self-pay

## 2023-02-25 DIAGNOSIS — H5213 Myopia, bilateral: Secondary | ICD-10-CM | POA: Diagnosis not present

## 2023-02-27 ENCOUNTER — Other Ambulatory Visit (HOSPITAL_COMMUNITY): Payer: Self-pay

## 2023-03-02 DIAGNOSIS — Z91018 Allergy to other foods: Secondary | ICD-10-CM | POA: Diagnosis not present

## 2023-03-02 DIAGNOSIS — R052 Subacute cough: Secondary | ICD-10-CM | POA: Diagnosis not present

## 2023-03-02 DIAGNOSIS — H1045 Other chronic allergic conjunctivitis: Secondary | ICD-10-CM | POA: Diagnosis not present

## 2023-03-02 DIAGNOSIS — J300 Vasomotor rhinitis: Secondary | ICD-10-CM | POA: Diagnosis not present

## 2023-03-04 ENCOUNTER — Other Ambulatory Visit: Payer: Self-pay

## 2023-03-04 ENCOUNTER — Other Ambulatory Visit (HOSPITAL_COMMUNITY): Payer: Self-pay

## 2023-03-04 DIAGNOSIS — Z0000 Encounter for general adult medical examination without abnormal findings: Secondary | ICD-10-CM | POA: Diagnosis not present

## 2023-03-04 DIAGNOSIS — K219 Gastro-esophageal reflux disease without esophagitis: Secondary | ICD-10-CM | POA: Diagnosis not present

## 2023-03-04 DIAGNOSIS — E781 Pure hyperglyceridemia: Secondary | ICD-10-CM | POA: Diagnosis not present

## 2023-03-04 DIAGNOSIS — E669 Obesity, unspecified: Secondary | ICD-10-CM | POA: Diagnosis not present

## 2023-03-04 DIAGNOSIS — I10 Essential (primary) hypertension: Secondary | ICD-10-CM | POA: Diagnosis not present

## 2023-03-04 DIAGNOSIS — Z125 Encounter for screening for malignant neoplasm of prostate: Secondary | ICD-10-CM | POA: Diagnosis not present

## 2023-03-04 MED ORDER — OMEPRAZOLE 40 MG PO CPDR
40.0000 mg | DELAYED_RELEASE_CAPSULE | Freq: Every day | ORAL | 1 refills | Status: AC
  Filled 2023-03-04 – 2023-06-08 (×2): qty 90, 90d supply, fill #0

## 2023-03-04 MED ORDER — IPRATROPIUM BROMIDE 0.03 % NA SOLN
2.0000 | Freq: Two times a day (BID) | NASAL | 3 refills | Status: AC
  Filled 2023-03-04: qty 90, 90d supply, fill #0
  Filled 2023-06-08: qty 90, 90d supply, fill #1

## 2023-03-04 MED ORDER — AMLODIPINE BESYLATE 5 MG PO TABS
5.0000 mg | ORAL_TABLET | Freq: Every day | ORAL | 1 refills | Status: AC
  Filled 2023-03-04 – 2023-06-08 (×2): qty 90, 90d supply, fill #0

## 2023-06-08 ENCOUNTER — Other Ambulatory Visit (HOSPITAL_COMMUNITY): Payer: Self-pay

## 2023-06-08 ENCOUNTER — Other Ambulatory Visit: Payer: Self-pay

## 2023-09-02 ENCOUNTER — Other Ambulatory Visit (HOSPITAL_COMMUNITY): Payer: Self-pay

## 2023-09-02 DIAGNOSIS — K219 Gastro-esophageal reflux disease without esophagitis: Secondary | ICD-10-CM | POA: Diagnosis not present

## 2023-09-02 DIAGNOSIS — I10 Essential (primary) hypertension: Secondary | ICD-10-CM | POA: Diagnosis not present

## 2023-09-02 MED ORDER — AMLODIPINE BESYLATE 5 MG PO TABS
5.0000 mg | ORAL_TABLET | Freq: Every day | ORAL | 1 refills | Status: AC
  Filled 2023-09-02 (×2): qty 90, 90d supply, fill #0
  Filled 2023-12-14: qty 90, 90d supply, fill #1

## 2023-09-02 MED ORDER — OMEPRAZOLE 40 MG PO CPDR
40.0000 mg | DELAYED_RELEASE_CAPSULE | Freq: Every day | ORAL | 1 refills | Status: AC
  Filled 2023-09-02: qty 90, 90d supply, fill #0
  Filled 2023-12-14: qty 90, 90d supply, fill #1

## 2023-09-04 ENCOUNTER — Other Ambulatory Visit (HOSPITAL_COMMUNITY): Payer: Self-pay

## 2023-10-08 ENCOUNTER — Other Ambulatory Visit (HOSPITAL_COMMUNITY): Payer: Self-pay

## 2023-10-08 MED ORDER — FLUZONE HIGH-DOSE 0.5 ML IM SUSY
0.5000 mL | PREFILLED_SYRINGE | Freq: Once | INTRAMUSCULAR | 0 refills | Status: AC
  Filled 2023-10-08: qty 0.5, 1d supply, fill #0

## 2023-11-09 ENCOUNTER — Other Ambulatory Visit (HOSPITAL_COMMUNITY): Payer: Self-pay

## 2023-12-14 ENCOUNTER — Other Ambulatory Visit (HOSPITAL_COMMUNITY): Payer: Self-pay
